# Patient Record
Sex: Male | Born: 1990 | Hispanic: Yes | Smoking: Never smoker
Health system: Southern US, Community
[De-identification: ages and names within clinical notes are randomized; demographics above are authoritative.]

---

## 2013-10-24 ENCOUNTER — Emergency Department (HOSPITAL_COMMUNITY): Payer: Self-pay

## 2013-10-24 ENCOUNTER — Encounter (HOSPITAL_COMMUNITY): Payer: Self-pay | Admitting: Emergency Medicine

## 2013-10-24 ENCOUNTER — Emergency Department (HOSPITAL_COMMUNITY)
Admission: EM | Admit: 2013-10-24 | Discharge: 2013-10-24 | Disposition: A | Discharge status: Home / Self Care | Payer: Self-pay | Attending: Emergency Medicine | Admitting: Emergency Medicine

## 2013-10-24 DIAGNOSIS — Z23 Encounter for immunization: Secondary | ICD-10-CM | POA: Insufficient documentation

## 2013-10-24 DIAGNOSIS — S61219A Laceration without foreign body of unspecified finger without damage to nail, initial encounter: Secondary | ICD-10-CM

## 2013-10-24 DIAGNOSIS — W260XXA Contact with knife, initial encounter: Secondary | ICD-10-CM | POA: Insufficient documentation

## 2013-10-24 DIAGNOSIS — S61209A Unspecified open wound of unspecified finger without damage to nail, initial encounter: Secondary | ICD-10-CM | POA: Insufficient documentation

## 2013-10-24 DIAGNOSIS — Y990 Civilian activity done for income or pay: Secondary | ICD-10-CM | POA: Insufficient documentation

## 2013-10-24 DIAGNOSIS — Y929 Unspecified place or not applicable: Secondary | ICD-10-CM | POA: Insufficient documentation

## 2013-10-24 MED ORDER — CEPHALEXIN 500 MG PO CAPS: 500.0000 mg | ORAL_CAPSULE | Freq: Four times a day (QID) | ORAL | Status: DC

## 2013-10-24 MED ORDER — TETANUS-DIPHTH-ACELL PERTUSSIS 5-2.5-18.5 LF-MCG/0.5 IM SUSP
0.5000 mL | Freq: Once | INTRAMUSCULAR | Status: AC
  Administered 2013-10-24: 0.5 mL via INTRAMUSCULAR
  Filled 2013-10-24: qty 0.5

## 2013-10-24 NOTE — ED Provider Notes (Signed)
CSN: [id number]     Arrival date & time 10/24/13  2105 History  This chart was scribed for non-physician practitioner, Sharilyn Sites, PA-C working with Richardean Canal, MD by Greggory Stallion, ED scribe. This patient was seen in room TR07C/TR07C and the patient's care was started at 9:37 PM.   Chief Complaint  Patient presents with  . Extremity Laceration   The history is provided by the patient. No language interpreter was used.   HPI Comments: Jake Arias is a 22 y.o. male who presents to the Emergency Department complaining of left middle finger laceration that occurred about an hour ago at work. Pt states he cut it with a knife.  Pt applied quick-clot prior to arrival which stopped the bleeding.  Unsure of last tetanus.  Pt is right hand dominant.  History reviewed. No pertinent past medical history. History reviewed. No pertinent past surgical history. History reviewed. No pertinent family history. History  Substance Use Topics  . Smoking status: Never Smoker   . Smokeless tobacco: Not on file  . Alcohol Use: No     Comment: socially    Review of Systems  Skin: Positive for wound.  All other systems reviewed and are negative.    Allergies  Review of patient's allergies indicates no known allergies.  Home Medications  No current outpatient prescriptions on file.  BP 145/81  Pulse 102  Temp(Src) 98.5 F (36.9 C) (Oral)  Resp 18  SpO2 98%  Physical Exam  Nursing note and vitals reviewed. Constitutional: He is oriented to person, place, and time. He appears well-developed and well-nourished. No distress.  HENT:  Head: Normocephalic and atraumatic.  Eyes: Conjunctivae and EOM are normal. Pupils are equal, round, and reactive to light.  Neck: Normal range of motion. Neck supple.  Cardiovascular: Normal rate, regular rhythm and normal heart sounds.   Pulmonary/Chest: Effort normal and breath sounds normal. No respiratory distress. He has no wheezes.  Musculoskeletal:  Normal range of motion.       Left hand: He exhibits tenderness and laceration. He exhibits normal range of motion, no bony tenderness, normal two-point discrimination, normal capillary refill, no deformity and no swelling. Normal sensation noted. Normal strength noted.       Hands: Avulsion laceration of left distal middle finger with some involvement of medial nail; full ROM of finger maintained; bleeding well controlled arrival with quick-clot present in wound; strong radial pulse and cap refill; sensation intact  Neurological: He is alert and oriented to person, place, and time.  Skin: Skin is warm and dry. He is not diaphoretic.  Psychiatric: He has a normal mood and affect.    ED Course  Procedures (including critical care time)  DIAGNOSTIC STUDIES: Oxygen Saturation is 98% on RA, normal by my interpretation.    COORDINATION OF CARE: 9:38 PM-Discussed treatment plan which includes xray with pt at bedside and pt agreed to plan.   Labs Review Labs Reviewed - No data to display Imaging Review Dg Finger Middle Left  10/24/2013   CLINICAL DATA:  Laceration tonight.  EXAM: LEFT MIDDLE FINGER 2+V  COMPARISON:  None.  FINDINGS: There is no evidence of fracture or dislocation. There is no evidence of arthropathy or other focal bone abnormality. Soft tissue laceration at the tip of the finger. No radiodense foreign body.  IMPRESSION: No acute osseous abnormality or foreign body.   Electronically Signed   By: Geanie Cooley M.D.   On: 10/24/2013 22:00    EKG Interpretation  None       MDM   1. Finger laceration, initial encounter    X-ray negative for bony involvement.  Avulsed tissue of left middle finger, not repairable.  Tetanus updated.  Wound irrigated and dressed in the ED.  Rx keflex.  Instructed to FU with hand surgeon if problems occur or for signs of infection.  Discussed plan with pt, he agreed.  Return precautions advised.  I personally performed the services described in  this documentation, which was scribed in my presence. The recorded information has been reviewed and is accurate.  Garlon Hatchet, PA-C 10/24/13 2328

## 2013-10-24 NOTE — ED Notes (Addendum)
Patient was at work and was washing dishes, he lacerated his middle left finger.  Bleeding controlled.  [employer] is employer.

## 2013-10-27 NOTE — ED Provider Notes (Signed)
Medical screening examination/treatment/procedure(s) were performed by non-physician practitioner and as supervising physician I was immediately available for consultation/collaboration.  EKG Interpretation   None         Richardean Canal, MD 10/27/13 0900

## 2016-03-16 ENCOUNTER — Emergency Department (HOSPITAL_COMMUNITY): Payer: Self-pay

## 2016-03-16 ENCOUNTER — Emergency Department (HOSPITAL_COMMUNITY)
Admission: EM | Admit: 2016-03-16 | Discharge: 2016-03-16 | Disposition: A | Discharge status: Home / Self Care | Payer: Self-pay | Attending: Emergency Medicine | Admitting: Emergency Medicine

## 2016-03-16 ENCOUNTER — Encounter (HOSPITAL_COMMUNITY): Payer: Self-pay | Admitting: Emergency Medicine

## 2016-03-16 DIAGNOSIS — R1011 Right upper quadrant pain: Secondary | ICD-10-CM

## 2016-03-16 DIAGNOSIS — K8050 Calculus of bile duct without cholangitis or cholecystitis without obstruction: Secondary | ICD-10-CM

## 2016-03-16 DIAGNOSIS — K8020 Calculus of gallbladder without cholecystitis without obstruction: Secondary | ICD-10-CM | POA: Insufficient documentation

## 2016-03-16 LAB — URINALYSIS, ROUTINE W REFLEX MICROSCOPIC
BILIRUBIN URINE: NEGATIVE
GLUCOSE, UA: NEGATIVE mg/dL
Hgb urine dipstick: NEGATIVE
KETONES UR: 15 mg/dL — AB
Leukocytes, UA: NEGATIVE
Nitrite: NEGATIVE
PH: 6 (ref 5.0–8.0)
Protein, ur: NEGATIVE mg/dL
Specific Gravity, Urine: 1.034 — ABNORMAL HIGH (ref 1.005–1.030)

## 2016-03-16 LAB — COMPREHENSIVE METABOLIC PANEL
ALT: 69 U/L — AB (ref 17–63)
AST: 38 U/L (ref 15–41)
Albumin: 4.9 g/dL (ref 3.5–5.0)
Alkaline Phosphatase: 77 U/L (ref 38–126)
Anion gap: 10 (ref 5–15)
BUN: 17 mg/dL (ref 6–20)
CO2: 22 mmol/L (ref 22–32)
Calcium: 9.6 mg/dL (ref 8.9–10.3)
Chloride: 104 mmol/L (ref 101–111)
Creatinine, Ser: 0.97 mg/dL (ref 0.61–1.24)
GFR calc Af Amer: >60 mL/min (ref >60)
GFR calc non Af Amer: >60 mL/min (ref >60)
Glucose, Bld: 126 mg/dL — ABNORMAL HIGH (ref 65–99)
Potassium: 3.7 mmol/L (ref 3.5–5.1)
Sodium: 136 mmol/L (ref 135–145)
TOTAL PROTEIN: 8.5 g/dL — AB (ref 6.5–8.1)
Total Bilirubin: 0.4 mg/dL (ref 0.3–1.2)

## 2016-03-16 LAB — CBC
HCT: 44.2 % (ref 39.0–52.0)
Hemoglobin: 14.7 g/dL (ref 13.0–17.0)
MCH: 30.8 pg (ref 26.0–34.0)
MCHC: 33.3 g/dL (ref 30.0–36.0)
MCV: 92.7 fL (ref 78.0–100.0)
PLATELETS: 205 10*3/uL (ref 150–400)
RBC: 4.77 MIL/uL (ref 4.22–5.81)
RDW: 12.6 % (ref 11.5–15.5)
WBC: 13.1 10*3/uL — ABNORMAL HIGH (ref 4.0–10.5)

## 2016-03-16 LAB — LIPASE, BLOOD: Lipase: 26 U/L (ref 11–51)

## 2016-03-16 MED ORDER — IBUPROFEN 600 MG PO TABS: 600.0000 mg | ORAL_TABLET | Freq: Four times a day (QID) | ORAL | Status: DC | PRN

## 2016-03-16 MED ORDER — OXYCODONE-ACETAMINOPHEN 5-325 MG PO TABS: 1.0000 | ORAL_TABLET | Freq: Four times a day (QID) | ORAL | Status: DC | PRN

## 2016-03-16 MED ORDER — FENTANYL CITRATE (PF) 100 MCG/2ML IJ SOLN
25.0000 ug | Freq: Once | INTRAMUSCULAR | Status: AC
  Administered 2016-03-16: 25 ug via INTRAVENOUS
  Filled 2016-03-16: qty 2

## 2016-03-16 MED ORDER — SODIUM CHLORIDE 0.9 % IV BOLUS (SEPSIS)
1000.0000 mL | Freq: Once | INTRAVENOUS | Status: AC
  Administered 2016-03-16: 1000 mL via INTRAVENOUS

## 2016-03-16 MED ORDER — ONDANSETRON HCL 4 MG/2ML IJ SOLN
4.0000 mg | Freq: Once | INTRAMUSCULAR | Status: AC
  Administered 2016-03-16: 4 mg via INTRAVENOUS
  Filled 2016-03-16: qty 2

## 2016-03-16 NOTE — ED Notes (Signed)
Patient transported to Ultrasound 

## 2016-03-16 NOTE — ED Provider Notes (Signed)
CSN: [id number]     Arrival date & time 03/16/16  1449 History   First MD Initiated Contact with Patient 03/16/16 1700     Chief Complaint  Patient presents with  . Abdominal Pain     (Consider location/radiation/quality/duration/timing/severity/associated sxs/prior Treatment) The history is provided by the patient.  Jake Arias is a 25 y.o. male here with RUQ pain, R flank pain. Acute onset of RUQ pain and R flank pain started at 2 am. It is constant, radiate to the flank. Has nausea but no vomiting. Denies urinary symptoms. Denies fever or chills. Denies eating bad food. Denies any chest pain or cough. He has kids at home that are also sick. Denies previous abdominal surgeries.    History reviewed. No pertinent past medical history. History reviewed. No pertinent past surgical history. No family history on file. Social History  Substance Use Topics  . Smoking status: Never Smoker   . Smokeless tobacco: None  . Alcohol Use: No     Comment: socially    Review of Systems  Gastrointestinal: Positive for abdominal pain.  All other systems reviewed and are negative.     Allergies  Review of patient's allergies indicates no known allergies.  Home Medications   Prior to Admission medications   Not on File   BP 140/77 mmHg  Pulse 79  Temp(Src) 99 F (37.2 C) (Oral)  Resp 18  Ht 5' (1.524 m)  Wt 188 lb 1 oz (85.305 kg)  BMI 36.73 kg/m2  SpO2 100% Physical Exam  Constitutional: He is oriented to person, place, and time. He appears well-developed and well-nourished.  HENT:  Head: Normocephalic.  Mouth/Throat: Oropharynx is clear and moist.  Eyes: Conjunctivae are normal. Pupils are equal, round, and reactive to light.  Neck: Normal range of motion. Neck supple.  Cardiovascular: Normal rate, regular rhythm and normal heart sounds.   Pulmonary/Chest: Effort normal and breath sounds normal. No respiratory distress. He has no wheezes. He has no rales.  Abdominal:  Soft. Bowel sounds are normal.  + RUQ tenderness, no obvious murphy's. Mild R CVAT. No periumbilical or lower abdominal tenderness   Musculoskeletal: Normal range of motion. He exhibits no edema or tenderness.  Neurological: He is alert and oriented to person, place, and time. No cranial nerve deficit. Coordination normal.  Skin: Skin is warm and dry.  Psychiatric: He has a normal mood and affect. His behavior is normal. Judgment and thought content normal.  Nursing note and vitals reviewed.   ED Course  Procedures (including critical care time) Labs Review Labs Reviewed  COMPREHENSIVE METABOLIC PANEL - Abnormal; Notable for the following:    Glucose, Bld 126 (*)    Total Protein 8.5 (*)    ALT 69 (*)    All other components within normal limits  CBC - Abnormal; Notable for the following:    WBC 13.1 (*)    All other components within normal limits  URINALYSIS, ROUTINE W REFLEX MICROSCOPIC (NOT AT [facility]) - Abnormal; Notable for the following:    Specific Gravity, Urine 1.034 (*)    Ketones, ur 15 (*)    All other components within normal limits  LIPASE, BLOOD    Imaging Review [place] Renal  03/16/2016  CLINICAL DATA:  Right flank pain for 1 day. No known injury. Initial encounter. EXAM: RENAL / URINARY TRACT ULTRASOUND COMPLETE COMPARISON:  None. FINDINGS: Right Kidney: Length: 12.3 cm. Echogenicity within normal limits. No mass or hydronephrosis visualized. Left Kidney: Length: 11.7 cm. Echogenicity  within normal limits. No mass or hydronephrosis visualized. Bladder: Appears normal for degree of bladder distention. IMPRESSION: Normal exam. Electronically Signed   By: Drusilla Kanner M.D.   On: 03/16/2016 18:37   US Abdomen Limited Ruq  03/16/2016  CLINICAL DATA:  Acute onset right upper quadrant pain today. No known injury. Initial encounter. EXAM: US ABDOMEN LIMITED - RIGHT UPPER QUADRANT COMPARISON:  None. FINDINGS: Gallbladder: Multiple gallstones are identified measuring up to 1.5  cm in diameter. There is no pericholecystic fluid or wall thickening. Sonographer reports negative Murphy's sign. Common bile duct: Diameter: 0.2 cm. Liver: Demonstrates increased echogenicity consistent with fatty infiltration. No focal lesion or intrahepatic biliary ductal dilatation is identified. Trace amount of perihepatic ascites is noted. IMPRESSION: Gallstones without evidence of cholecystitis. Fatty infiltration of the liver. Trace perihepatic ascites. Electronically Signed   By: Drusilla Kanner M.D.   On: 03/16/2016 18:39   I have personally reviewed and evaluated these images and lab results as part of my medical decision-making.   EKG Interpretation None      MDM   Final diagnoses:  RUQ pain    Jake Arias is a 25 y.o. male here with RUQ pain, R Flank pain. Consider biliary colic vs renal colic vs gastro vs pancreatitis. Will get labs, [place], UA. Will hydrate and reassess.   7:48 PM WBC 13. UA nl. US showed gallstones with no acute cholecystitis. Pain controlled. Will dc home with percocet, surgery follow up, recommend life style modifications.     Richardean Canal, MD 03/16/16 [phone]

## 2016-03-16 NOTE — Discharge Instructions (Signed)
Take motrin for pain.   Take percocet for severe pain. Do NOT drive with it.   Try to lose weight, avoid fatty foods.   See your doctor. See surgery for follow up   Return to ER if you have worse abdominal pain, vomiting, fevers

## 2016-03-16 NOTE — ED Notes (Signed)
Pt c/o abdominal pain onset 0200 today. Pt denies N/V.

## 2016-05-20 ENCOUNTER — Emergency Department (HOSPITAL_COMMUNITY): Payer: Self-pay

## 2016-05-20 ENCOUNTER — Encounter (HOSPITAL_COMMUNITY): Payer: Self-pay | Admitting: Emergency Medicine

## 2016-05-20 ENCOUNTER — Emergency Department (HOSPITAL_COMMUNITY)
Admission: EM | Admit: 2016-05-20 | Discharge: 2016-05-20 | Disposition: A | Discharge status: Home / Self Care | Payer: Self-pay | Attending: Emergency Medicine | Admitting: Emergency Medicine

## 2016-05-20 DIAGNOSIS — K2990 Gastroduodenitis, unspecified, without bleeding: Secondary | ICD-10-CM | POA: Insufficient documentation

## 2016-05-20 LAB — I-STAT CHEM 8, ED
BUN: 12 mg/dL (ref 6–20)
Calcium, Ion: 1.15 mmol/L (ref 1.12–1.23)
Chloride: 98 mmol/L — ABNORMAL LOW (ref 101–111)
Creatinine, Ser: 0.7 mg/dL (ref 0.61–1.24)
Glucose, Bld: 128 mg/dL — ABNORMAL HIGH (ref 65–99)
HEMATOCRIT: 47 % (ref 39.0–52.0)
HEMOGLOBIN: 16 g/dL (ref 13.0–17.0)
POTASSIUM: 3.9 mmol/L (ref 3.5–5.1)
SODIUM: 137 mmol/L (ref 135–145)
TCO2: 25 mmol/L (ref 0–100)

## 2016-05-20 LAB — CBC WITH DIFFERENTIAL/PLATELET
Basophils Absolute: 0 10*3/uL (ref 0.0–0.1)
Basophils Relative: 0 %
EOS PCT: 1 %
Eosinophils Absolute: 0.2 10*3/uL (ref 0.0–0.7)
HCT: 44.6 % (ref 39.0–52.0)
Hemoglobin: 14.9 g/dL (ref 13.0–17.0)
LYMPHS PCT: 11 %
Lymphs Abs: 2.2 10*3/uL (ref 0.7–4.0)
MCH: 30.5 pg (ref 26.0–34.0)
MCHC: 33.4 g/dL (ref 30.0–36.0)
MCV: 91.2 fL (ref 78.0–100.0)
MONO ABS: 1 10*3/uL (ref 0.1–1.0)
Monocytes Relative: 5 %
Neutro Abs: 16 10*3/uL — ABNORMAL HIGH (ref 1.7–7.7)
Neutrophils Relative %: 83 %
PLATELETS: 194 10*3/uL (ref 150–400)
RBC: 4.89 MIL/uL (ref 4.22–5.81)
RDW: 12.6 % (ref 11.5–15.5)
WBC: 19.4 10*3/uL — ABNORMAL HIGH (ref 4.0–10.5)

## 2016-05-20 LAB — RAPID URINE DRUG SCREEN, HOSP PERFORMED
Amphetamines: NOT DETECTED
Barbiturates: NOT DETECTED
Benzodiazepines: NOT DETECTED
COCAINE: NOT DETECTED
Opiates: NOT DETECTED
Tetrahydrocannabinol: NOT DETECTED

## 2016-05-20 LAB — URINALYSIS, ROUTINE W REFLEX MICROSCOPIC
Bilirubin Urine: NEGATIVE
GLUCOSE, UA: NEGATIVE mg/dL
HGB URINE DIPSTICK: NEGATIVE
Ketones, ur: NEGATIVE mg/dL
LEUKOCYTES UA: NEGATIVE
Nitrite: NEGATIVE
PH: 6 (ref 5.0–8.0)
Protein, ur: NEGATIVE mg/dL
Specific Gravity, Urine: 1.016 (ref 1.005–1.030)

## 2016-05-20 LAB — LIPASE, BLOOD: Lipase: 23 U/L (ref 11–51)

## 2016-05-20 LAB — HEPATIC FUNCTION PANEL
ALT: 40 U/L (ref 17–63)
AST: 29 U/L (ref 15–41)
Albumin: 4.6 g/dL (ref 3.5–5.0)
Alkaline Phosphatase: 81 U/L (ref 38–126)
BILIRUBIN DIRECT: 0.2 mg/dL (ref 0.1–0.5)
Indirect Bilirubin: 0.5 mg/dL (ref 0.3–0.9)
TOTAL PROTEIN: 8.1 g/dL (ref 6.5–8.1)
Total Bilirubin: 0.7 mg/dL (ref 0.3–1.2)

## 2016-05-20 MED ORDER — FENTANYL CITRATE (PF) 100 MCG/2ML IJ SOLN
100.0000 ug | Freq: Once | INTRAMUSCULAR | Status: AC
  Administered 2016-05-20: 100 ug via INTRAVENOUS
  Filled 2016-05-20: qty 2

## 2016-05-20 MED ORDER — METRONIDAZOLE 500 MG PO TABS: 500.0000 mg | ORAL_TABLET | Freq: Three times a day (TID) | ORAL | Status: DC

## 2016-05-20 MED ORDER — AMOXICILLIN 500 MG PO CAPS: 500.0000 mg | ORAL_CAPSULE | Freq: Three times a day (TID) | ORAL | Status: DC

## 2016-05-20 MED ORDER — GI COCKTAIL ~~LOC~~
30.0000 mL | Freq: Once | ORAL | Status: AC
  Administered 2016-05-20: 30 mL via ORAL
  Filled 2016-05-20: qty 30

## 2016-05-20 MED ORDER — OMEPRAZOLE 20 MG PO CPDR: 20.0000 mg | DELAYED_RELEASE_CAPSULE | Freq: Every day | ORAL | Status: DC

## 2016-05-20 MED ORDER — IOPAMIDOL (ISOVUE-300) INJECTION 61%
100.0000 mL | Freq: Once | INTRAVENOUS | Status: AC | PRN
  Administered 2016-05-20: 100 mL via INTRAVENOUS

## 2016-05-20 NOTE — Discharge Instructions (Signed)
Duodenitis (Duodenitis) Duodenitis es la inflamacin del revestimiento de la primera parte del intestino delgado (duodeno). Hay dos tipos de duodenitis:   Duodenitis aguda (se desarrolla repentinamente y es de corta duracin).   Duodenitis crnica (desarrolla durante un perodo prolongado y dura meses o aos). CAUSAS  La causa de la duodenitis casi siempre es una infeccin por la bacteria Helicobacter pylori (H. pylori). El H. pylori aumenta la produccin de cido del estmago y Paediatric nurse cambios en el entorno del duodeno. Esto irrita y daa las clulas del duodeno causando inflamacin.  Otras causas de duodenitis son:   El uso por un largo plazo de medicamentos antiinflamatorios no esteroides (AINE). Los [employer] modifican el revestimiento del duodeno y lo hacen ms propenso a las lesiones por el cido Data processing manager.  El consumo excesivo de alcohol. El alcohol aumenta los cidos del estmago y modifica el revestimiento del duodeno, lo que lo hace ms propenso a Radio broadcast assistant.  Giardiasis. La giardiasis es una infeccin frecuente del intestino delgado. Produce inflamacin en el duodeno.   Otros trastornos gastrointestinales, como la enfermedad de Crohn. Las personas con estos trastornos son ms propensas a desarrollar duodenitis. SNTOMAS  Aunque duodenitis no siempre causa sntomas, podr sentir:   Nuseas o vmitos.  Gases, sensacin de hinchazn o sensacin incmoda de plenitud despus de comer.  Ardor, clicos o dolor en la zona superior del abdomen. DIAGNSTICO  Para el diagnstico de duodenitis, el mdico puede [employer] de:   Examen del duodeno utilizando un tubo delgado con una cmara diminuta en la punta, que se coloca a travs de la garganta (endoscopio). El endoscopio se pasa a travs del estmago y [place]. A veces se toma una muestra de tejido del duodeno con el endoscopio. La muestra se examina luego bajo el microscopio (biopsia) para detectar signos  de inflamacin e infeccin por H. pylori.   Estudios para Publishing copy de [place] o de heces y Engineer, manufacturing infeccin por H. pylori i.   Un estudio para [employer] gases en una muestra de aliento exhalado para la infeccin por  H. pylori. Esta prueba mide los niveles de dixido de carbono en el aliento despus de beber una solucin especial.  Radiografas luego de beber un lquido especial que hace resaltar el tracto digestivo (bario) para encontrar signos de inflamacin. TRATAMIENTO  El tratamiento depender de la causa de la duodenitis. Los tratamientos ms comunes incluyen:   Frmacos para tratar la infeccin.  Medicamentos para reducir Primary school teacher.  Suspender el uso de AINEs.  Control de otras afecciones gastrointestinales.  Evitar el consumo de alcohol. Adems, si sigue las siguientes indicaciones podr reducir la gravedad de sus sntomas:   Beba suficiente agua para mantener la orina clara o de color amarillo plido.  Evite el consumo de los siguientes alimentos o bebidas:  Bebidas que contengan cafena.  Chocolate.  Alimentos o bebidas saborizadas con menta o mentol.  Ajo.  Cebollas.  Comidas muy condimentadas.  Ctricos como naranjas, limones o limas.  Alimentos que contengan salsa de [place], como salsas para pastas, chiles y pizza.  Alimentos muy grasos.  Comidas fritas.   Esta informacin no tiene Theme park manager el consejo del mdico. Asegrese de hacerle al mdico cualquier pregunta que tenga.   Document Released: 04/04/2013 Document Revised: 12/29/2014 Elsevier Interactive Patient Education 2016 [employer].  Gastritis - Adultos  (Gastritis, Adult)  Gastrittis es la hinchazn e irritacin (inflamacin) del revestimiento interno del estmago. Si no recibe tratamiento, la gastritis puede  causar sangrado y llagas.(lceras) en el estmago. CUIDADOS EN EL HOGAR   Slo tome los medicamentos segn le indique el mdico.  Si le han recetado  antibiticos, tmelos segn las indicaciones. Termine de tomar el medicamento, aunque comience a sentirse mejor.  Beba gran cantidad de lquido para mantenLeggett & Platter el pis (orina) de tono claro o amarillo plido.  Evite las comidas y bebidas que United Stationersempeoran los problemas. Los alimentos que debe evitar son:  Geri SeminoleCafena y alcohol.  Chocolate.  Menta.  Ajo y cebolla.  Comidas muy condimentadas.  Ctricos como naranjas, limones o limas.  Alimentos que contengan tomate, como salsas, [place] y pizza.  Alimentos fritos y Lexicographergrasos.  Haga comidas pequeas durante Glass blower/designerel da en lugar de 3 comidas abundantes. SOLICITE AYUDA DE INMEDIATO SI:   La materia fecal (heces)es negra o de color rojo oscuro.  Vomita sangre. Puede ser similar a la borra del caf  No puede retener los lquidos.  El dolor en el vientre (abdominal) empeora.  Tiene fiebre.  No mejora luego de 1 semana.  Tiene preguntas o preocupaciones. ASEGRESE DE QUE:   Comprende estas instrucciones.  Controlar su enfermedad.  Solicitar ayuda de inmediato si no mejora o si empeora.   Esta informacin no tiene Theme park managercomo fin reemplazar el consejo del mdico. Asegrese de hacerle al mdico cualquier pregunta que tenga.   Document Released: 06/08/2012 Elsevier Interactive Patient Education [employer]2016 Elsevier Inc.

## 2016-05-20 NOTE — ED Notes (Signed)
Pt returns from CT with c/o pain ar RUQ at 3/10. Pt denies vomiting and speaks easlily. Family at bedside.

## 2016-05-20 NOTE — ED Provider Notes (Addendum)
CSN: [id number]     Arrival date & time 05/20/16  0426 History   First MD Initiated Contact with Patient 05/20/16 [phone]     Chief Complaint  Patient presents with  . Cholelithiasis     (Consider location/radiation/quality/duration/timing/severity/associated sxs/prior Treatment) Patient is a 25 y.o. male presenting with abdominal pain. The history is provided by the patient.  Abdominal Pain Pain location:  RUQ, R flank, LUQ, epigastric and periumbilical Pain quality: not tugging   Pain radiates to:  Does not radiate Pain severity:  Severe Onset quality:  Gradual Duration:  9 weeks Timing:  Constant Progression:  Unchanged Chronicity:  New Context: not alcohol use and not sick contacts   Relieved by:  Nothing Worsened by:  Nothing tried Ineffective treatments:  None tried Associated symptoms: no anorexia, no dysuria, no fever, no hematuria, no nausea and no vomiting   Risk factors: no alcohol abuse     History reviewed. No pertinent past medical history. History reviewed. No pertinent past surgical history. History reviewed. No pertinent family history. Social History  Substance Use Topics  . Smoking status: Never Smoker   . Smokeless tobacco: None  . Alcohol Use: No     Comment: socially    Review of Systems  Constitutional: Negative for fever.  Gastrointestinal: Positive for abdominal pain. Negative for nausea, vomiting and anorexia.  Genitourinary: Negative for dysuria and hematuria.  All other systems reviewed and are negative.     Allergies  Review of patient's allergies indicates no known allergies.  Home Medications   Prior to Admission medications   Medication Sig Start Date End Date Taking? Authorizing Provider  ibuprofen (ADVIL,MOTRIN) 600 MG tablet Take 1 tablet (600 mg total) by mouth every 6 (six) hours as needed. 03/16/16   Richardean Canalavid H Yao, MD  oxyCODONE-acetaminophen (PERCOCET) 5-325 MG tablet Take 1 tablet by mouth every 6 (six) hours as needed.  03/16/16   Richardean Canalavid H Yao, MD   BP 123/82 mmHg  Pulse 82  Temp(Src) 97.4 F (36.3 C) (Oral)  Resp 18  SpO2 98% Physical Exam  Constitutional: He is oriented to person, place, and time. He appears well-developed and well-nourished. No distress.  HENT:  Head: Normocephalic and atraumatic.  Mouth/Throat: Oropharynx is clear and moist.  Eyes: Conjunctivae are normal. Pupils are equal, round, and reactive to light.  Neck: Normal range of motion. Neck supple.  Cardiovascular: Normal rate, regular rhythm and intact distal pulses.   Pulmonary/Chest: Effort normal and breath sounds normal. No respiratory distress. He has no wheezes. He has no rales.  Abdominal: Soft. Bowel sounds are normal. There is no tenderness. There is no rigidity, no rebound, no guarding, no tenderness at McBurney's point and negative Murphy's sign. No hernia.  Musculoskeletal: Normal range of motion.  Neurological: He is alert and oriented to person, place, and time.  Skin: Skin is warm and dry.  Psychiatric: He has a normal mood and affect.    ED Course  Procedures (including critical care time) Labs Review Labs Reviewed  CBC WITH DIFFERENTIAL/PLATELET - Abnormal; Notable for the following:    WBC 19.4 (*)    Neutro Abs 16.0 (*)    All other components within normal limits  I-STAT CHEM 8, ED - Abnormal; Notable for the following:    Chloride 98 (*)    Glucose, Bld 128 (*)    All other components within normal limits  HEPATIC FUNCTION PANEL  LIPASE, BLOOD  URINE RAPID DRUG SCREEN, HOSP PERFORMED    Imaging Review  No results found. I have personally reviewed and evaluated these images and lab results as part of my medical decision-making.   EKG Interpretation None      MDM   Final diagnoses:  None    Filed Vitals:   05/20/16 0630 05/20/16 0645  BP: 123/69 119/68  Pulse: 96 97  Temp:    Resp:  14   Results for orders placed or performed during the hospital encounter of 05/20/16  CBC with  Differential/Platelet  Result Value Ref Range   WBC 19.4 (H) 4.0 - 10.5 K/uL   RBC 4.89 4.22 - 5.81 MIL/uL   Hemoglobin 14.9 13.0 - 17.0 g/dL   HCT 16.1 09.6 - 04.5 %   MCV 91.2 78.0 - 100.0 fL   MCH 30.5 26.0 - 34.0 pg   MCHC 33.4 30.0 - 36.0 g/dL   RDW 40.9 81.1 - 91.4 %   Platelets 194 150 - 400 K/uL   Neutrophils Relative % 83 %   Neutro Abs 16.0 (H) 1.7 - 7.7 K/uL   Lymphocytes Relative 11 %   Lymphs Abs 2.2 0.7 - 4.0 K/uL   Monocytes Relative 5 %   Monocytes Absolute 1.0 0.1 - 1.0 K/uL   Eosinophils Relative 1 %   Eosinophils Absolute 0.2 0.0 - 0.7 K/uL   Basophils Relative 0 %   Basophils Absolute 0.0 0.0 - 0.1 K/uL  Hepatic function panel  Result Value Ref Range   Total Protein 8.1 6.5 - 8.1 g/dL   Albumin 4.6 3.5 - 5.0 g/dL   AST 29 15 - 41 U/L   ALT 40 17 - 63 U/L   Alkaline Phosphatase 81 38 - 126 U/L   Total Bilirubin 0.7 0.3 - 1.2 mg/dL   Bilirubin, Direct 0.2 0.1 - 0.5 mg/dL   Indirect Bilirubin 0.5 0.3 - 0.9 mg/dL  Lipase, blood  Result Value Ref Range   Lipase 23 11 - 51 U/L  Urine rapid drug screen (hosp performed)  Result Value Ref Range   Opiates NONE DETECTED NONE DETECTED   Cocaine NONE DETECTED NONE DETECTED   Benzodiazepines NONE DETECTED NONE DETECTED   Amphetamines NONE DETECTED NONE DETECTED   Tetrahydrocannabinol NONE DETECTED NONE DETECTED   Barbiturates NONE DETECTED NONE DETECTED  Urinalysis, Routine w reflex microscopic (not at [facility])  Result Value Ref Range   Color, Urine YELLOW YELLOW   APPearance CLEAR CLEAR   Specific Gravity, Urine 1.016 1.005 - 1.030   pH 6.0 5.0 - 8.0   Glucose, UA NEGATIVE NEGATIVE mg/dL   Hgb urine dipstick NEGATIVE NEGATIVE   Bilirubin Urine NEGATIVE NEGATIVE   Ketones, ur NEGATIVE NEGATIVE mg/dL   Protein, ur NEGATIVE NEGATIVE mg/dL   Nitrite NEGATIVE NEGATIVE   Leukocytes, UA NEGATIVE NEGATIVE  I-Stat Chem 8, ED  Result Value Ref Range   Sodium 137 135 - 145 mmol/L   Potassium 3.9 3.5 - 5.1 mmol/L    Chloride 98 (L) 101 - 111 mmol/L   BUN 12 6 - 20 mg/dL   Creatinine, Ser 7.82 0.61 - 1.24 mg/dL   Glucose, Bld 956 (H) 65 - 99 mg/dL   Calcium, Ion 2.13 0.86 - 1.23 mmol/L   TCO2 25 0 - 100 mmol/L   Hemoglobin 16.0 13.0 - 17.0 g/dL   HCT 57.8 46.9 - 62.9 %   Ct Abdomen Pelvis W Contrast  05/20/2016  CLINICAL DATA:  Right upper quadrant pain EXAM: CT ABDOMEN AND PELVIS WITH CONTRAST TECHNIQUE: Multidetector CT imaging of the abdomen and  pelvis was performed using the standard protocol following bolus administration of intravenous contrast. CONTRAST:  100 mL Isovue-300 COMPARISON:  03/16/2016 FINDINGS: Lung bases are free of acute infiltrate or sizable effusion. The liver, spleen, adrenal glands and pancreas are within normal limits. The gallbladder is well distended. The previously seen gallstones are not well appreciated on this study. No biliary obstruction is seen. Kidneys are well visualized bilaterally. No renal calculi or urinary tract obstructive changes are seen. The appendix is within normal limits. The bladder is partially distended. No pelvic mass lesion or sidewall abnormality is noted. The osseous structures show no acute abnormality. IMPRESSION: No acute abnormality noted. Electronically Signed   By: Alcide Clever M.D.   On: 05/20/2016 07:15     Medication List    TAKE these medications        amoxicillin 500 MG capsule  Commonly known as:  AMOXIL  Take 1 capsule (500 mg total) by mouth 3 (three) times daily.     metroNIDAZOLE 500 MG tablet  Commonly known as:  FLAGYL  Take 1 tablet (500 mg total) by mouth 3 (three) times daily.     omeprazole 20 MG capsule  Commonly known as:  PRILOSEC  Take 1 capsule (20 mg total) by mouth daily.      ASK your doctor about these medications        ibuprofen 600 MG tablet  Commonly known as:  ADVIL,MOTRIN  Take 1 tablet (600 mg total) by mouth every 6 (six) hours as needed.        Given the ongoing upper abdomen pain without  ceasing for 2 months and no LFT abnormalities with elevated WBC count I suspect gastritis and ulcer disease.  As he has no insurance follow up will be difficult so we are opting to treat H. Pylori with quad cocktail of flagyl, amoxicillin, PPI BID and peptobismol which I have instructed the patient to buy over the counter.  While there are several treatments for H. Pylori, this particular regimen is recommended but has the best chance of compliance  As these medication are on 4 dollar list at [facility], as the patient does not have insurance.  I explained that he will need to eat [place] yogurt as a probiotic to present GI issues from antibiotics.  Patient will need to eat a bland diet and we went over in detail what he can and cannot eat.  He is instructed to follow up with GI, and he and his family verbal understanding and agree to follow up.  He was given strict abdominal return precautions.      Cy Blamer, MD 05/20/16 0750  Shariah Assad, MD 05/20/16 [phone]

## 2016-05-20 NOTE — ED Notes (Signed)
Pt  And family states they understand instructions. Interpretor phone used to answer all questions.

## 2016-05-20 NOTE — ED Notes (Signed)
Pt arrived to ED via POV. Pain from previous gall stones never went away. Pain present on right side and radiates to right flank area. Pain rated at 10 on scale of 0 to 10. Attempted Ibuprofen at home with no relief.

## 2016-05-20 NOTE — ED Notes (Signed)
Patient transported to CT 

## 2018-07-09 ENCOUNTER — Other Ambulatory Visit: Payer: Self-pay

## 2018-07-09 ENCOUNTER — Encounter (HOSPITAL_COMMUNITY): Payer: Self-pay | Admitting: Obstetrics and Gynecology

## 2018-07-09 DIAGNOSIS — R739 Hyperglycemia, unspecified: Secondary | ICD-10-CM | POA: Diagnosis present

## 2018-07-09 DIAGNOSIS — K8067 Calculus of gallbladder and bile duct with acute and chronic cholecystitis with obstruction: Principal | ICD-10-CM | POA: Diagnosis present

## 2018-07-09 NOTE — ED Triage Notes (Signed)
Per Pt: Pt reports abdominal/flank pain that started last night. Pt reports he last went to the bathroom this morning.  Pt reports no change in appetite  Pt reports pain 8/10

## 2018-07-10 ENCOUNTER — Inpatient Hospital Stay (HOSPITAL_COMMUNITY)
Admission: EM | Admit: 2018-07-10 | Discharge: 2018-07-13 | DRG: 419 | Disposition: A | Discharge status: Home / Self Care | Payer: Self-pay | Attending: Surgery | Admitting: Surgery

## 2018-07-10 ENCOUNTER — Inpatient Hospital Stay (HOSPITAL_COMMUNITY): Payer: Self-pay

## 2018-07-10 ENCOUNTER — Other Ambulatory Visit: Payer: Self-pay

## 2018-07-10 ENCOUNTER — Encounter (HOSPITAL_COMMUNITY): Payer: Self-pay | Admitting: *Deleted

## 2018-07-10 ENCOUNTER — Emergency Department (HOSPITAL_COMMUNITY): Payer: Self-pay

## 2018-07-10 DIAGNOSIS — R945 Abnormal results of liver function studies: Secondary | ICD-10-CM

## 2018-07-10 DIAGNOSIS — K8050 Calculus of bile duct without cholangitis or cholecystitis without obstruction: Secondary | ICD-10-CM

## 2018-07-10 DIAGNOSIS — R1011 Right upper quadrant pain: Secondary | ICD-10-CM

## 2018-07-10 DIAGNOSIS — K8020 Calculus of gallbladder without cholecystitis without obstruction: Secondary | ICD-10-CM | POA: Diagnosis present

## 2018-07-10 DIAGNOSIS — R7401 Elevation of levels of liver transaminase levels: Secondary | ICD-10-CM

## 2018-07-10 DIAGNOSIS — K8001 Calculus of gallbladder with acute cholecystitis with obstruction: Secondary | ICD-10-CM

## 2018-07-10 DIAGNOSIS — R740 Nonspecific elevation of levels of transaminase and lactic acid dehydrogenase [LDH]: Secondary | ICD-10-CM

## 2018-07-10 DIAGNOSIS — R52 Pain, unspecified: Secondary | ICD-10-CM

## 2018-07-10 LAB — URINALYSIS, ROUTINE W REFLEX MICROSCOPIC
BACTERIA UA: NONE SEEN
BILIRUBIN URINE: NEGATIVE
Glucose, UA: NEGATIVE mg/dL
Ketones, ur: 5 mg/dL — AB
Leukocytes, UA: NEGATIVE
NITRITE: NEGATIVE
PROTEIN: NEGATIVE mg/dL
SPECIFIC GRAVITY, URINE: 1.018 (ref 1.005–1.030)
pH: 7 (ref 5.0–8.0)

## 2018-07-10 LAB — ETHANOL: Alcohol, Ethyl (B): <10 mg/dL (ref <10)

## 2018-07-10 LAB — COMPREHENSIVE METABOLIC PANEL
ALT: 733 U/L — ABNORMAL HIGH (ref 0–44)
ANION GAP: 8 (ref 5–15)
AST: 586 U/L — AB (ref 15–41)
Albumin: 4.9 g/dL (ref 3.5–5.0)
Alkaline Phosphatase: 114 U/L (ref 38–126)
BILIRUBIN TOTAL: 3.4 mg/dL — AB (ref 0.3–1.2)
BUN: 12 mg/dL (ref 6–20)
CHLORIDE: 103 mmol/L (ref 98–111)
CO2: 25 mmol/L (ref 22–32)
Calcium: 9.4 mg/dL (ref 8.9–10.3)
Creatinine, Ser: 0.73 mg/dL (ref 0.61–1.24)
GFR calc Af Amer: >60 mL/min (ref >60)
GLUCOSE: 137 mg/dL — AB (ref 70–99)
POTASSIUM: 3.9 mmol/L (ref 3.5–5.1)
Sodium: 136 mmol/L (ref 135–145)
Total Protein: 8.1 g/dL (ref 6.5–8.1)

## 2018-07-10 LAB — CBC
HEMATOCRIT: 43.3 % (ref 39.0–52.0)
HEMOGLOBIN: 14.9 g/dL (ref 13.0–17.0)
MCH: 31.8 pg (ref 26.0–34.0)
MCHC: 34.4 g/dL (ref 30.0–36.0)
MCV: 92.5 fL (ref 78.0–100.0)
Platelets: 209 10*3/uL (ref 150–400)
RBC: 4.68 MIL/uL (ref 4.22–5.81)
RDW: 12.5 % (ref 11.5–15.5)
WBC: 6.1 10*3/uL (ref 4.0–10.5)

## 2018-07-10 LAB — LIPASE, BLOOD: LIPASE: 31 U/L (ref 11–51)

## 2018-07-10 LAB — ACETAMINOPHEN LEVEL

## 2018-07-10 MED ORDER — MORPHINE SULFATE (PF) 2 MG/ML IV SOLN
1.0000 mg | INTRAVENOUS | Status: DC | PRN
  Administered 2018-07-10 (×2): 2 mg via INTRAVENOUS
  Filled 2018-07-10 (×2): qty 1

## 2018-07-10 MED ORDER — GADOBENATE DIMEGLUMINE 529 MG/ML IV SOLN
20.0000 mL | Freq: Once | INTRAVENOUS | Status: AC | PRN
  Administered 2018-07-10: 18 mL via INTRAVENOUS

## 2018-07-10 MED ORDER — KCL IN DEXTROSE-NACL 20-5-0.9 MEQ/L-%-% IV SOLN
INTRAVENOUS | Status: DC
  Administered 2018-07-10 – 2018-07-11 (×2): via INTRAVENOUS
  Filled 2018-07-10 (×2): qty 1000

## 2018-07-10 MED ORDER — MORPHINE SULFATE (PF) 4 MG/ML IV SOLN
4.0000 mg | Freq: Once | INTRAVENOUS | Status: AC
  Administered 2018-07-10: 4 mg via INTRAVENOUS
  Filled 2018-07-10: qty 1

## 2018-07-10 MED ORDER — ONDANSETRON 4 MG PO TBDP: 4.0000 mg | ORAL_TABLET | Freq: Four times a day (QID) | ORAL | Status: DC | PRN

## 2018-07-10 MED ORDER — FAMOTIDINE IN NACL 20-0.9 MG/50ML-% IV SOLN
20.0000 mg | Freq: Two times a day (BID) | INTRAVENOUS | Status: DC
  Administered 2018-07-10 (×2): 20 mg via INTRAVENOUS
  Filled 2018-07-10 (×2): qty 50

## 2018-07-10 MED ORDER — ONDANSETRON HCL 4 MG/2ML IJ SOLN: 4.0000 mg | Freq: Four times a day (QID) | INTRAMUSCULAR | Status: DC | PRN

## 2018-07-10 MED ORDER — CHLORHEXIDINE GLUCONATE 0.12 % MT SOLN
15.0000 mL | Freq: Two times a day (BID) | OROMUCOSAL | Status: DC
  Administered 2018-07-10 – 2018-07-12 (×4): 15 mL via OROMUCOSAL
  Filled 2018-07-10 (×5): qty 15

## 2018-07-10 MED ORDER — HEPARIN SODIUM (PORCINE) 5000 UNIT/ML IJ SOLN
5000.0000 [IU] | Freq: Three times a day (TID) | INTRAMUSCULAR | Status: DC
  Administered 2018-07-10 – 2018-07-11 (×3): 5000 [IU] via SUBCUTANEOUS
  Filled 2018-07-10 (×3): qty 1

## 2018-07-10 MED ORDER — ORAL CARE MOUTH RINSE: 15.0000 mL | Freq: Two times a day (BID) | OROMUCOSAL | Status: DC

## 2018-07-10 MED ORDER — METHOCARBAMOL 500 MG PO TABS: 500.0000 mg | ORAL_TABLET | Freq: Four times a day (QID) | ORAL | Status: DC | PRN

## 2018-07-10 NOTE — H&P (View-Only) (Signed)
[facility] Consult: 8:56 AM 07/10/2018  LOS: 0 days    Referring Provider: Dr Nicanor Alcon  Primary Care Physician:  Patient, No Pcp Per Primary Gastroenterologist:  unassigned     Reason for Consultation:  Abdominal pain, gallstones,    HPI: Jake Arias is a 27 y.o. male.  Generally healthy young man who works in [employer].  Episodes of right upper quadrant and flank pain prompting a few visits to the ED in 2017.  Ultrasound then showed uncomplicated gallstones, LFTs normal white count elevated to 13.  Working diagnosis was biliary colic versus renal colic versus gastritis versus pancreatitis.  Patient did not have suggested surgical follow-up.    Starting Thursday night after eating a sandwich he developed pain in his right upper quadrant, no nausea, no jaundice.  Pain did radiate to the scapular area.  He had a prescription for Protonix which she took but did not help with the pain.  He presented to the ED early this morning.  Pain improved following 4 mg morphine.  Last ate Thursday night, currently hungry and would love to have some water.  T bili 3.4.  Alkaline phosphatase 114.  AST/ALT 586/733. No anemia, no elevated white count.  Platelets normal. APAP level less than 10. Glucose 137. Abdominal ultrasound: uncomplicated cholelithiasis.  No gallbladder wall thickening or pericholecystic fluid.  CBD 5mm. MRCP ordered.  Patient drinks 6-12 beers every couple of weeks but does not drink on a daily basis. Family history negative for gallbladder, pancreas, peptic ulcer disease or gastrointestinal cancers.  He knows of an uncle who has diabetes.   History reviewed. No pertinent past medical history.  History reviewed. No pertinent surgical history.  Prior to Admission medications   Medication Sig  Start Date End Date Taking? Authorizing Provider  pantoprazole (PROTONIX) 20 MG tablet Take 20 mg by mouth daily.   Yes [provider]    Scheduled Meds:  Infusions:  PRN Meds:    Allergies as of 07/09/2018  . (No Known Allergies)    No family history on file.  Social History   Socioeconomic History  . Marital status: Single    Spouse name: Not on file  . Number of children: Not on file  . Years of education: Not on file  . Highest education level: Not on file  Occupational History  . Not on file  Social Needs  . Financial resource strain: Not on file  . Food insecurity:    Worry: Not on file    Inability: Not on file  . Transportation needs:    Medical: Not on file    Non-medical: Not on file  Tobacco Use  . Smoking status: Never Smoker  . Smokeless tobacco: Never Used  Substance and Sexual Activity  . Alcohol use: Yes    Comment: socially  . Drug use: No  . Sexual activity: Not on file  Lifestyle  . Physical activity:    Days per week: Not on file    Minutes per session: Not on file  . Stress: Not on  file  Relationships  . Social connections:    Talks on phone: Not on file    Gets together: Not on file    Attends religious service: Not on file    Active member of club or organization: Not on file    Attends meetings of clubs or organizations: Not on file    Relationship status: Not on file  . Intimate partner violence:    Fear of current or ex partner: Not on file    Emotionally abused: Not on file    Physically abused: Not on file    Forced sexual activity: Not on file  Other Topics Concern  . Not on file  Social History Narrative  . Not on file    REVIEW OF SYSTEMS: Constitutional: No weakness, no fatigue. ENT:  No nose bleeds Pulm: No shortness of breath.  No cough. CV:  No palpitations, no LE edema.  GU:  No hematuria, no frequency.  Skin treated and very yellow compared to normal. GI: No dysphagia.  No altered bowel  habits. Heme: No issues with excessive or unusual bleeding or bruising. Transfusions: Never. Neuro:  No headaches, no peripheral tingling or numbness.   No dizziness, no syncope. Derm:  No itching, no rash or sores.  No jaundice. Endocrine:  No sweats or chills.  No polyuria or dysuria Immunization: Did not inquire as to recent or previous vaccinations. Travel:  None beyond local counties in last few months.    PHYSICAL EXAM: Vital signs in last 24 hours: Vitals:   07/10/18 0700 07/10/18 0830  BP: (!) 103/55 128/79  Pulse: 65 87  Resp: 16 18  Temp:    SpO2: 99% 97%   Wt Readings from Last 3 Encounters:  03/16/16 188 lb 1 oz (85.3 kg)    General: Pleasant, looks well and comfortable.  Interviewed using the Furniture conservator/restorervideo-assisted translator. Head: No facial asymmetry, signs of head trauma or swelling. Eyes: Icterus, no conjunctival pallor.  EOMI. Ears: Not HOH. Nose: No congestion or discharge. Mouth: Good dentition.  Moist, pink, clear oral mucosa.  Tongue midline. Neck: No JVD, no masses, no thyromegaly. Lungs: Clear bilaterally no labored breathing or cough. Heart: RRR.  No MRG.  S1, S2 present Abdomen: Soft.  Nondistended.  Minimal tenderness in the right upper quadrant without guarding or rebound.  No HSM, bruits, hernias, masses.  Bowel sounds active.   Rectal: Deferred Musc/Skeltl: No joint redness, swelling or significant deformity Extremities: No CCE. Neurologic: Alert.  Oriented x3.  Moves all 4 limbs, strength not tested.  No tremor or gross neurologic deficits. Skin: No obvious jaundice, could be missed given he is Hispanic and has olive complexion. Nodes:  No cervical adenopathy   Psych:  Pleasant , calm, cooperative.  Fluid speech.    Intake/Output from previous day: No intake/output data recorded. Intake/Output this shift: No intake/output data recorded.  LAB RESULTS: Recent Labs    07/10/18 0033  WBC 6.1  HGB 14.9  HCT 43.3  PLT 209   BMET Lab Results   Component Value Date   NA 136 07/10/2018   NA 137 05/20/2016   NA 136 03/16/2016   K 3.9 07/10/2018   K 3.9 05/20/2016   K 3.7 03/16/2016   CL 103 07/10/2018   CL 98 (L) 05/20/2016   CL 104 03/16/2016   CO2 25 07/10/2018   CO2 22 03/16/2016   GLUCOSE 137 (H) 07/10/2018   GLUCOSE 128 (H) 05/20/2016   GLUCOSE 126 (H) 03/16/2016   BUN  12 07/10/2018   BUN 12 05/20/2016   BUN 17 03/16/2016   CREATININE 0.73 07/10/2018   CREATININE 0.70 05/20/2016   CREATININE 0.97 03/16/2016   CALCIUM 9.4 07/10/2018   CALCIUM 9.6 03/16/2016   LFT Recent Labs    07/10/18 0033  PROT 8.1  ALBUMIN 4.9  AST 586*  ALT 733*  ALKPHOS 114  BILITOT 3.4*   PT/INR No results found for: INR, PROTIME Hepatitis Panel No results for input(s): HEPBSAG, HCVAB, HEPAIGM, HEPBIGM in the last 72 hours. Lipase     Component Value Date/Time   LIPASE 31 07/10/2018 0033    Drugs of Abuse     Component Value Date/Time   LABOPIA NONE DETECTED 05/20/2016 0520   COCAINSCRNUR NONE DETECTED 05/20/2016 0520   LABBENZ NONE DETECTED 05/20/2016 0520   AMPHETMU NONE DETECTED 05/20/2016 0520   THCU NONE DETECTED 05/20/2016 0520   LABBARB NONE DETECTED 05/20/2016 0520     RADIOLOGY STUDIES: US Abdomen Complete  Result Date: 07/10/2018 CLINICAL DATA:  Initial evaluation for acute epigastric abdominal pain, elevated LFTs EXAM: ABDOMEN ULTRASOUND COMPLETE COMPARISON:  None. FINDINGS: Gallbladder: Multiple shadowing stones present within the gallbladder lumen measuring up to 7 mm in size. Gallbladder wall measure within normal limits at 2.2 mm. No free pericholecystic fluid. No sonographic Murphy sign elicited on exam. Common bile duct: Diameter: 5 mm Liver: No focal lesion identified. Within normal limits in parenchymal echogenicity. Portal vein is patent on color Doppler imaging with normal direction of blood flow towards the liver. IVC: No abnormality visualized. Pancreas: Visualized portion unremarkable. Spleen:  Size and appearance within normal limits. Right Kidney: Length: 11.6 cm. Echogenicity within normal limits. No mass or hydronephrosis visualized. Left Kidney: Length: 11.2 cm. Echogenicity within normal limits. No mass or hydronephrosis visualized. Abdominal aorta: No aneurysm visualized. Other findings: None. IMPRESSION: 1. Cholelithiasis without sonographic features for acute cholecystitis. 2. No biliary dilatation. 3. Otherwise normal abdominal ultrasound. Electronically Signed   By: Rise Mu M.D.   On: 07/10/2018 06:33     IMPRESSION:   *   Symptomatic cholecystitis.   Elevated LFTs may soley be due to cholelithiasis, cholecystitis but MRCP oredered to r/o choledocholithiasis (less likeley given 5 mm CBD).   Dr Carolynne Edouard has seen pt.  *   Hyperglycemia, informed pt of this.      PLAN:     *   Await results of to be completed MRCP.   Needs 4 to 6 hours NPO for this but afterwards ok to have clears, he is asking about food/water.    *   LFTs in AM.     Jennye Moccasin  07/10/2018, 8:56 AM Phone [phone]

## 2018-07-10 NOTE — H&P (Signed)
Jake Arias is an 27 y.o. male.   Chief Complaint: abd pain HPI: The patient is a 27 year old hispanic male who presents with abdominal pain that started Thursday night. Pain has been severe. Pain is associated with nausea but no vomiting. U/s shows gallstones and lft's elevated  History reviewed. No pertinent past medical history.  History reviewed. No pertinent surgical history.  No family history on file. Social History:  reports that he has never smoked. He has never used smokeless tobacco. He reports that he drinks alcohol. He reports that he does not use drugs.  Allergies: No Known Allergies   (Not in a hospital admission)  Results for orders placed or performed during the hospital encounter of 07/10/18 (from the past 48 hour(s))  Urinalysis, Routine w reflex microscopic     Status: Abnormal   Collection Time: 07/09/18 11:04 PM  Result Value Ref Range   Color, Urine YELLOW YELLOW   APPearance TURBID (A) CLEAR   Specific Gravity, Urine 1.018 1.005 - 1.030   pH 7.0 5.0 - 8.0   Glucose, UA NEGATIVE NEGATIVE mg/dL   Hgb urine dipstick SMALL (A) NEGATIVE   Bilirubin Urine NEGATIVE NEGATIVE   Ketones, ur 5 (A) NEGATIVE mg/dL   Protein, ur NEGATIVE NEGATIVE mg/dL   Nitrite NEGATIVE NEGATIVE   Leukocytes, UA NEGATIVE NEGATIVE   RBC / HPF 0-5 0 - 5 RBC/hpf   WBC, UA 0-5 0 - 5 WBC/hpf   Bacteria, UA NONE SEEN NONE SEEN   Amorphous Crystal PRESENT     Comment: Performed at [facility], [place] [address] [place], [place] [address]  Lipase, blood     Status: None   Collection Time: 07/10/18 12:33 AM  Result Value Ref Range   Lipase 31 11 - 51 U/L    Comment: Performed at [facility], [place] [address] [place], [place] [address]  Comprehensive metabolic panel     Status: Abnormal   Collection Time: 07/10/18 12:33 AM  Result Value Ref Range   Sodium 136 135 - 145 mmol/L   Potassium 3.9 3.5 - 5.1 mmol/L   Chloride 103 98 - 111 mmol/L     Comment: Please note change in reference range.   CO2 25 22 - 32 mmol/L   Glucose, Bld 137 (H) 70 - 99 mg/dL    Comment: Please note change in reference range.   BUN 12 6 - 20 mg/dL    Comment: Please note change in reference range.   Creatinine, Ser 0.73 0.61 - 1.24 mg/dL   Calcium 9.4 8.9 - 10.3 mg/dL   Total Protein 8.1 6.5 - 8.1 g/dL   Albumin 4.9 3.5 - 5.0 g/dL   AST 586 (H) 15 - 41 U/L   ALT 733 (H) 0 - 44 U/L    Comment: Please note change in reference range.   Alkaline Phosphatase 114 38 - 126 U/L   Total Bilirubin 3.4 (H) 0.3 - 1.2 mg/dL   GFR calc non Af Amer >60 >60 mL/min   GFR calc Af Amer >60 >60 mL/min    Comment: (NOTE) The eGFR has been calculated using the CKD EPI equation. This calculation has not been validated in all clinical situations. eGFR's persistently <60 mL/min signify possible Chronic Kidney Disease.    Anion gap 8 5 - 15    Comment: Performed at [facility], [place] [address] [place],  [address]  CBC     Status: None   Collection  Time: 07/10/18 12:33 AM  Result Value Ref Range   WBC 6.1 4.0 - 10.5 K/uL   RBC 4.68 4.22 - 5.81 MIL/uL   Hemoglobin 14.9 13.0 - 17.0 g/dL   HCT 43.3 39.0 - 52.0 %   MCV 92.5 78.0 - 100.0 fL   MCH 31.8 26.0 - 34.0 pg   MCHC 34.4 30.0 - 36.0 g/dL   RDW 12.5 11.5 - 15.5 %   Platelets 209 150 - 400 K/uL    Comment: Performed at [facility], [place] [address], [place], [place] [address]  Ethanol     Status: None   Collection Time: 07/10/18  3:09 AM  Result Value Ref Range   Alcohol, Ethyl (B) <10 <10 mg/dL    Comment: (NOTE) Lowest detectable limit for serum alcohol is 10 mg/dL. For medical purposes only. Performed at [facility], [place] [address] [place], [place] [address]   Acetaminophen level     Status: Abnormal   Collection Time: 07/10/18  3:09 AM  Result Value Ref Range   Acetaminophen (Tylenol), Serum <10 (L) 10 - 30 ug/mL    Comment:  (NOTE) Therapeutic concentrations vary significantly. A range of 10-30 ug/mL  may be an effective concentration for many patients. However, some  are best treated at concentrations outside of this range. Acetaminophen concentrations >150 ug/mL at 4 hours after ingestion  and >50 ug/mL at 12 hours after ingestion are often associated with  toxic reactions. Performed at [facility], [place] [address], [place], [place] [address]    US Abdomen Complete  Result Date: 07/10/2018 CLINICAL DATA:  Initial evaluation for acute epigastric abdominal pain, elevated LFTs EXAM: ABDOMEN ULTRASOUND COMPLETE COMPARISON:  None. FINDINGS: Gallbladder: Multiple shadowing stones present within the gallbladder lumen measuring up to 7 mm in size. Gallbladder wall measure within normal limits at 2.2 mm. No free pericholecystic fluid. No sonographic Murphy sign elicited on exam. Common bile duct: Diameter: 5 mm Liver: No focal lesion identified. Within normal limits in parenchymal echogenicity. Portal vein is patent on color Doppler imaging with normal direction of blood flow towards the liver. IVC: No abnormality visualized. Pancreas: Visualized portion unremarkable. Spleen: Size and appearance within normal limits. Right Kidney: Length: 11.6 cm. Echogenicity within normal limits. No mass or hydronephrosis visualized. Left Kidney: Length: 11.2 cm. Echogenicity within normal limits. No mass or hydronephrosis visualized. Abdominal aorta: No aneurysm visualized. Other findings: None. IMPRESSION: 1. Cholelithiasis without sonographic features for acute cholecystitis. 2. No biliary dilatation. 3. Otherwise normal abdominal ultrasound. Electronically Signed   By: Jeannine Boga M.D.   On: 07/10/2018 06:33    Review of Systems  Constitutional: Negative.   HENT: Negative.   Eyes: Negative.   Respiratory: Negative.   Cardiovascular: Negative.   Gastrointestinal: Positive for abdominal pain, nausea and  vomiting.  Genitourinary: Negative.   Musculoskeletal: Negative.   Skin: Negative.   Neurological: Negative.   Endo/Heme/Allergies: Negative.   Psychiatric/Behavioral: Negative.     Blood pressure 128/79, pulse 87, temperature 99.3 F (37.4 C), temperature source Oral, resp. rate 18, SpO2 97 %. Physical Exam  Constitutional: He is oriented to person, place, and time. He appears well-developed and well-nourished. No distress.  HENT:  Head: Normocephalic and atraumatic.  Mouth/Throat: No oropharyngeal exudate.  Eyes: Pupils are equal, round, and reactive to light. Conjunctivae and EOM are normal.  Neck: Normal range of motion. Neck supple.  Cardiovascular: Normal rate, regular rhythm and normal heart sounds.  Respiratory: Effort normal and  breath sounds normal. No stridor. No respiratory distress.  GI: Soft. Bowel sounds are normal. There is tenderness.  Musculoskeletal: Normal range of motion. He exhibits no edema or tenderness.  Neurological: He is alert and oriented to person, place, and time. Coordination normal.  Skin: Skin is warm and dry. No rash noted.  Psychiatric: He has a normal mood and affect. His behavior is normal. Thought content normal.     Assessment/Plan The patient appears to have gallstones and elevated lft's worrisome for cbd stones. Will consult GI and plan for MRCP. He will likely benefit from removal of gallbladder during this admission once the lft's improve  TOTH III, S, MD 07/10/2018, 9:02 AM

## 2018-07-10 NOTE — Consult Note (Signed)
                                                                            Gastroenterology Consult: 8:56 AM 07/10/2018  LOS: 0 days    Referring Provider: Dr Palumbo  Primary Care Physician:  Patient, No Pcp Per Primary Gastroenterologist:  unassigned     Reason for Consultation:  Abdominal pain, gallstones,    HPI: Quinlin Leon Ortiz is a 27 y.o. male.  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  Episodes of right upper quadrant and flank pain prompting a few visits to the ED in 2017.  Ultrasound then showed uncomplicated gallstones, LFTs normal white count elevated to 13.  Working diagnosis was biliary colic versus renal colic versus gastritis versus pancreatitis.  Patient did not have suggested surgical follow-up.    Starting Thursday night after eating a sandwich he developed pain in his right upper quadrant, no nausea, no jaundice.  Pain did radiate to the scapular area.  He had a prescription for Protonix which she took but did not help with the pain.  He presented to the ED early this morning.  Pain improved following 4 mg morphine.  Last ate Thursday night, currently hungry and would love to have some water.  T bili 3.4.  Alkaline phosphatase 114.  AST/ALT 586/733. No anemia, no elevated white count.  Platelets normal. APAP level less than 10. Glucose 137. Abdominal ultrasound: uncomplicated cholelithiasis.  No gallbladder wall thickening or pericholecystic fluid.  CBD 5mm. MRCP ordered.  Patient drinks 6-12 beers every couple of weeks but does not drink on a daily basis. Family history negative for gallbladder, pancreas, peptic ulcer disease or gastrointestinal cancers.  He knows of an uncle who has diabetes.   History reviewed. No pertinent past medical history.  History reviewed. No pertinent surgical history.  Prior to Admission medications   Medication Sig  Start Date End Date Taking? Authorizing Provider  pantoprazole (PROTONIX) 20 MG tablet Take 20 mg by mouth daily.   Yes [provider]    Scheduled Meds:  Infusions:  PRN Meds:    Allergies as of 07/09/2018  . (No Known Allergies)    No family history on file.  Social History   Socioeconomic History  . Marital status: Single    Spouse name: Not on file  . Number of children: Not on file  . Years of education: Not on file  . Highest education level: Not on file  Occupational History  . Not on file  Social Needs  . Financial resource strain: Not on file  . Food insecurity:    Worry: Not on file    Inability: Not on file  . Transportation needs:    Medical: Not on file    Non-medical: Not on file  Tobacco Use  . Smoking status: Never Smoker  . Smokeless tobacco: Never Used  Substance and Sexual Activity  . Alcohol use: Yes    Comment: socially  . Drug use: No  . Sexual activity: Not on file  Lifestyle  . Physical activity:    Days per week: Not on file    Minutes per session: Not on file  . Stress: Not on   file  Relationships  . Social connections:    Talks on phone: Not on file    Gets together: Not on file    Attends religious service: Not on file    Active member of club or organization: Not on file    Attends meetings of clubs or organizations: Not on file    Relationship status: Not on file  . Intimate partner violence:    Fear of current or ex partner: Not on file    Emotionally abused: Not on file    Physically abused: Not on file    Forced sexual activity: Not on file  Other Topics Concern  . Not on file  Social History Narrative  . Not on file    REVIEW OF SYSTEMS: Constitutional: No weakness, no fatigue. ENT:  No nose bleeds Pulm: No shortness of breath.  No cough. CV:  No palpitations, no LE edema.  GU:  No hematuria, no frequency.  Skin treated and very yellow compared to normal. GI: No dysphagia.  No altered bowel  habits. Heme: No issues with excessive or unusual bleeding or bruising. Transfusions: Never. Neuro:  No headaches, no peripheral tingling or numbness.   No dizziness, no syncope. Derm:  No itching, no rash or sores.  No jaundice. Endocrine:  No sweats or chills.  No polyuria or dysuria Immunization: Did not inquire as to recent or previous vaccinations. Travel:  None beyond local counties in last few months.    PHYSICAL EXAM: Vital signs in last 24 hours: Vitals:   07/10/18 0700 07/10/18 0830  BP: (!) 103/55 128/79  Pulse: 65 87  Resp: 16 18  Temp:    SpO2: 99% 97%   Wt Readings from Last 3 Encounters:  03/16/16 188 lb 1 oz (85.3 kg)    General: Pleasant, looks well and comfortable.  Interviewed using the video-assisted translator. Head: No facial asymmetry, signs of head trauma or swelling. Eyes: Icterus, no conjunctival pallor.  EOMI. Ears: Not HOH. Nose: No congestion or discharge. Mouth: Good dentition.  Moist, pink, clear oral mucosa.  Tongue midline. Neck: No JVD, no masses, no thyromegaly. Lungs: Clear bilaterally no labored breathing or cough. Heart: RRR.  No MRG.  S1, S2 present Abdomen: Soft.  Nondistended.  Minimal tenderness in the right upper quadrant without guarding or rebound.  No HSM, bruits, hernias, masses.  Bowel sounds active.   Rectal: Deferred Musc/Skeltl: No joint redness, swelling or significant deformity Extremities: No CCE. Neurologic: Alert.  Oriented x3.  Moves all 4 limbs, strength not tested.  No tremor or gross neurologic deficits. Skin: No obvious jaundice, could be missed given he is Hispanic and has olive complexion. Nodes:  No cervical adenopathy   Psych:  Pleasant , calm, cooperative.  Fluid speech.    Intake/Output from previous day: No intake/output data recorded. Intake/Output this shift: No intake/output data recorded.  LAB RESULTS: Recent Labs    07/10/18 0033  WBC 6.1  HGB 14.9  HCT 43.3  PLT 209   BMET Lab Results   Component Value Date   NA 136 07/10/2018   NA 137 05/20/2016   NA 136 03/16/2016   K 3.9 07/10/2018   K 3.9 05/20/2016   K 3.7 03/16/2016   CL 103 07/10/2018   CL 98 (L) 05/20/2016   CL 104 03/16/2016   CO2 25 07/10/2018   CO2 22 03/16/2016   GLUCOSE 137 (H) 07/10/2018   GLUCOSE 128 (H) 05/20/2016   GLUCOSE 126 (H) 03/16/2016   BUN   12 07/10/2018   BUN 12 05/20/2016   BUN 17 03/16/2016   CREATININE 0.73 07/10/2018   CREATININE 0.70 05/20/2016   CREATININE 0.97 03/16/2016   CALCIUM 9.4 07/10/2018   CALCIUM 9.6 03/16/2016   LFT Recent Labs    07/10/18 0033  PROT 8.1  ALBUMIN 4.9  AST 586*  ALT 733*  ALKPHOS 114  BILITOT 3.4*   PT/INR No results found for: INR, PROTIME Hepatitis Panel No results for input(s): HEPBSAG, HCVAB, HEPAIGM, HEPBIGM in the last 72 hours. Lipase     Component Value Date/Time   LIPASE 31 07/10/2018 0033    Drugs of Abuse     Component Value Date/Time   LABOPIA NONE DETECTED 05/20/2016 0520   COCAINSCRNUR NONE DETECTED 05/20/2016 0520   LABBENZ NONE DETECTED 05/20/2016 0520   AMPHETMU NONE DETECTED 05/20/2016 0520   THCU NONE DETECTED 05/20/2016 0520   LABBARB NONE DETECTED 05/20/2016 0520     RADIOLOGY STUDIES: Us Abdomen Complete  Result Date: 07/10/2018 CLINICAL DATA:  Initial evaluation for acute epigastric abdominal pain, elevated LFTs EXAM: ABDOMEN ULTRASOUND COMPLETE COMPARISON:  None. FINDINGS: Gallbladder: Multiple shadowing stones present within the gallbladder lumen measuring up to 7 mm in size. Gallbladder wall measure within normal limits at 2.2 mm. No free pericholecystic fluid. No sonographic Murphy sign elicited on exam. Common bile duct: Diameter: 5 mm Liver: No focal lesion identified. Within normal limits in parenchymal echogenicity. Portal vein is patent on color Doppler imaging with normal direction of blood flow towards the liver. IVC: No abnormality visualized. Pancreas: Visualized portion unremarkable. Spleen:  Size and appearance within normal limits. Right Kidney: Length: 11.6 cm. Echogenicity within normal limits. No mass or hydronephrosis visualized. Left Kidney: Length: 11.2 cm. Echogenicity within normal limits. No mass or hydronephrosis visualized. Abdominal aorta: No aneurysm visualized. Other findings: None. IMPRESSION: 1. Cholelithiasis without sonographic features for acute cholecystitis. 2. No biliary dilatation. 3. Otherwise normal abdominal ultrasound. Electronically Signed   By: Benjamin  McClintock M.D.   On: 07/10/2018 06:33     IMPRESSION:   *   Symptomatic cholecystitis.   Elevated LFTs may soley be due to cholelithiasis, cholecystitis but MRCP oredered to r/o choledocholithiasis (less likeley given 5 mm CBD).   Dr Toth has seen pt.  *   Hyperglycemia, informed pt of this.      PLAN:     *   Await results of to be completed MRCP.   Needs 4 to 6 hours NPO for this but afterwards ok to have clears, he is asking about food/water.    *   LFTs in AM.     Morgane Joerger  07/10/2018, 8:56 AM Phone [phone]     

## 2018-07-10 NOTE — ED Provider Notes (Signed)
[facility]-EMERGENCY DEPT Provider Note   CSN: [id number] Arrival date & time: 07/09/18  2256     History   Chief Complaint Chief Complaint  Patient presents with  . Flank Pain  . Abdominal Pain    HPI Jake Arias is a 27 y.o. male with a hx of no major medical problems presents to the Emergency Department complaining of gradual, persistent, progressively worsening epigastric and RUQ abd pain onset last night after eating a sandwich with Jalapenos.  Patient reports the pain improved overnight but returned again this morning and became constant.  Patient reports the pain is a 10/10 located in the epigastrium and right upper quadrant.  He reports it radiates to his right flank.  Movement makes his symptoms worse.  He took pantoprazole and acetaminophen for pain without relief.  Pt reports social EtOH consumption several times per month.  He denies drug usage, specifically IV drug usage.  Pt reports his appetite has otherwise been normal.  He denies regular NSAID usage, aspirin or Goody powders.  He he denies history of peptic ulcer disease.  He denies previous abdominal surgeries.  Patient reports associated nausea without vomiting or diarrhea.  No dysuria or hematuria.     The history is provided by the patient and medical records. No language interpreter was used.    History reviewed. No pertinent past medical history.  There are no active problems to display for this patient.   History reviewed. No pertinent surgical history.      Home Medications    Prior to Admission medications   Medication Sig Start Date End Date Taking? Authorizing Provider  pantoprazole (PROTONIX) 20 MG tablet Take 20 mg by mouth daily.   Yes [provider]    Family History No family history on file.  Social History Social History   Tobacco Use  . Smoking status: Never Smoker  . Smokeless tobacco: Never Used  Substance Use Topics  . Alcohol use: Yes   Comment: socially  . Drug use: No     Allergies   Patient has no known allergies.   Review of Systems Review of Systems  Constitutional: Negative for appetite change, diaphoresis, fatigue, fever and unexpected weight change.  HENT: Negative for mouth sores.   Eyes: Negative for visual disturbance.  Respiratory: Negative for cough, chest tightness, shortness of breath and wheezing.   Cardiovascular: Negative for chest pain.  Gastrointestinal: Positive for abdominal pain and nausea. Negative for constipation, diarrhea and vomiting.  Endocrine: Negative for polydipsia, polyphagia and polyuria.  Genitourinary: Positive for flank pain. Negative for dysuria, frequency, hematuria and urgency.  Musculoskeletal: Negative for back pain and neck stiffness.  Skin: Negative for rash.  Allergic/Immunologic: Negative for immunocompromised state.  Neurological: Negative for syncope, light-headedness and headaches.  Hematological: Does not bruise/bleed easily.  Psychiatric/Behavioral: Negative for sleep disturbance. The patient is not nervous/anxious.      Physical Exam Updated Vital Signs BP (!) 123/92 (BP Location: Right Arm)   Pulse 73   Temp 99.3 F (37.4 C) (Oral)   Resp (!) 24   SpO2 100%   Physical Exam  Constitutional: He appears well-developed and well-nourished. No distress.  Awake, alert, nontoxic appearance  HENT:  Head: Normocephalic and atraumatic.  Mouth/Throat: Oropharynx is clear and moist. No oropharyngeal exudate.  Eyes: Conjunctivae are normal. No scleral icterus.  Neck: Normal range of motion. Neck supple.  Cardiovascular: Normal rate, regular rhythm and intact distal pulses.  Pulmonary/Chest: Effort normal and breath sounds  normal. Tachypnea noted. No respiratory distress. He has no wheezes.  Equal chest expansion  Abdominal: Soft. Bowel sounds are normal. He exhibits no mass. There is tenderness in the right upper quadrant and epigastric area. There is guarding,  CVA tenderness ( right) and positive Murphy's sign. There is no rigidity, no rebound and no tenderness at McBurney's point.  Musculoskeletal: Normal range of motion. He exhibits no edema.  Neurological: He is alert.  Speech is clear and goal oriented Moves extremities without ataxia  Skin: Skin is warm and dry. He is not diaphoretic.  Psychiatric: He has a normal mood and affect.  Nursing note and vitals reviewed.    ED Treatments / Results  Labs (all labs ordered are listed, but only abnormal results are displayed) Labs Reviewed  COMPREHENSIVE METABOLIC PANEL - Abnormal; Notable for the following components:      Result Value   Glucose, Bld 137 (*)    AST 586 (*)    ALT 733 (*)    Total Bilirubin 3.4 (*)    All other components within normal limits  URINALYSIS, ROUTINE W REFLEX MICROSCOPIC - Abnormal; Notable for the following components:   APPearance TURBID (*)    Hgb urine dipstick SMALL (*)    Ketones, ur 5 (*)    All other components within normal limits  ACETAMINOPHEN LEVEL - Abnormal; Notable for the following components:   Acetaminophen (Tylenol), Serum <10 (*)    All other components within normal limits  LIPASE, BLOOD  CBC  ETHANOL    EKG None  Radiology US Abdomen Complete  Result Date: 07/10/2018 CLINICAL DATA:  Initial evaluation for acute epigastric abdominal pain, elevated LFTs EXAM: ABDOMEN ULTRASOUND COMPLETE COMPARISON:  None. FINDINGS: Gallbladder: Multiple shadowing stones present within the gallbladder lumen measuring up to 7 mm in size. Gallbladder wall measure within normal limits at 2.2 mm. No free pericholecystic fluid. No sonographic Murphy sign elicited on exam. Common bile duct: Diameter: 5 mm Liver: No focal lesion identified. Within normal limits in parenchymal echogenicity. Portal vein is patent on color Doppler imaging with normal direction of blood flow towards the liver. IVC: No abnormality visualized. Pancreas: Visualized portion  unremarkable. Spleen: Size and appearance within normal limits. Right Kidney: Length: 11.6 cm. Echogenicity within normal limits. No mass or hydronephrosis visualized. Left Kidney: Length: 11.2 cm. Echogenicity within normal limits. No mass or hydronephrosis visualized. Abdominal aorta: No aneurysm visualized. Other findings: None. IMPRESSION: 1. Cholelithiasis without sonographic features for acute cholecystitis. 2. No biliary dilatation. 3. Otherwise normal abdominal ultrasound. Electronically Signed   By: Rise Mu M.D.   On: 07/10/2018 06:33    Procedures Procedures (including critical care time)  Medications Ordered in ED Medications  morphine 4 MG/ML injection 4 mg (4 mg Intravenous Given 07/10/18 0340)     Initial Impression / Assessment and Plan / ED Course  I have reviewed the triage vital signs and the nursing notes.  Pertinent labs & imaging results that were available during my care of the patient were reviewed by me and considered in my medical decision making (see chart for details).  Clinical Course as of Jul 10 716  Sat Jul 10, 2018  0455 Elevated  AST(!): 586 [HM]  0455 No leukocytosis  WBC: 6.1 [HM]  0455 No urinary tract infection  Ketones, ur(!): 5 [HM]  0455 Significantly elevated  Total Bilirubin(!): 3.4 [HM]    Clinical Course User Index [HM] Bosco Paparella, Boyd Kerbs     Patient presents emergency department with  severe right upper quadrant abdominal pain that radiates to his right back.  Elevated transaminases and total bili.  Cholelithiasis is noted on ultrasound.  No specific sonographic Eulah PontMurphy sign however patient has exquisite tenderness to palpation in the right upper quadrant and difficult to control pain.  No vomiting here.  Patient is n.p.o.  Will need surgical evaluation and admission for this.  The patient was discussed with and seen by Dr. Nicanor AlconPalumbo who agrees with the treatment plan.  Discussed with Dr. Carolynne Edouardoth.He requests GI consult  and will plan to admit.    Discussed with Dr. Rhea BeltonPyrtle who will evaluate.  Final Clinical Impressions(s) / ED Diagnoses   Final diagnoses:  Transaminitis  Calculus of gallbladder with acute cholecystitis and obstruction  Right upper quadrant abdominal pain    ED Discharge Orders    None       Milta DeitersMuthersbaugh, Arcadia Gorgas, PA-C 07/10/18 [id number]    Palumbo, April, MD 07/10/18 [phone]

## 2018-07-10 NOTE — ED Notes (Signed)
MD at bedside. 

## 2018-07-10 NOTE — ED Notes (Signed)
ED TO INPATIENT HANDOFF REPORT  Name/Age/Gender Jake Arias 27 y.o. male  Code Status   Home/SNF/Other Home  Chief Complaint abd pain / back pain   Level of Care/Admitting Diagnosis ED Disposition    ED Disposition Condition [facility] Area: [facility] [[id number]]  Level of Care: Med-Surg [16]  Diagnosis: Cholelithiasis [010932]  Admitting Physician: Jovita Kussmaul [phone]  Attending Physician: CCS, MD [[id number]]  Estimated length of stay: 3 - 4 days  Certification:: I certify this patient will need inpatient services for at least 2 midnights  PT Class (Do Not Modify): Inpatient [101]  PT Acc Code (Do Not Modify): Private [1]       Medical History History reviewed. No pertinent past medical history.  Allergies No Known Allergies  IV Location/Drains/Wounds Patient Lines/Drains/Airways Status   Active Line/Drains/Airways    Name:   Placement date:   Placement time:   Site:   Days:   Peripheral IV 05/20/16 Right Forearm   05/20/16    0501    Forearm   781   Peripheral IV 07/10/18 Left Antecubital   07/10/18    0132    Antecubital   less than 1          Labs/Imaging Results for orders placed or performed during the hospital encounter of 07/10/18 (from the past 48 hour(s))  Urinalysis, Routine w reflex microscopic     Status: Abnormal   Collection Time: 07/09/18 11:04 PM  Result Value Ref Range   Color, Urine YELLOW YELLOW   APPearance TURBID (A) CLEAR   Specific Gravity, Urine 1.018 1.005 - 1.030   pH 7.0 5.0 - 8.0   Glucose, UA NEGATIVE NEGATIVE mg/dL   Hgb urine dipstick SMALL (A) NEGATIVE   Bilirubin Urine NEGATIVE NEGATIVE   Ketones, ur 5 (A) NEGATIVE mg/dL   Protein, ur NEGATIVE NEGATIVE mg/dL   Nitrite NEGATIVE NEGATIVE   Leukocytes, UA NEGATIVE NEGATIVE   RBC / HPF 0-5 0 - 5 RBC/hpf   WBC, UA 0-5 0 - 5 WBC/hpf   Bacteria, UA NONE SEEN NONE SEEN   Amorphous Crystal PRESENT     Comment: Performed at [facility], [place] [address], [place], [place] [address]  Lipase, blood     Status: None   Collection Time: 07/10/18 12:33 AM  Result Value Ref Range   Lipase 31 11 - 51 U/L    Comment: Performed at [facility], [place] [address], [place], [place] [address]  Comprehensive metabolic panel     Status: Abnormal   Collection Time: 07/10/18 12:33 AM  Result Value Ref Range   Sodium 136 135 - 145 mmol/L   Potassium 3.9 3.5 - 5.1 mmol/L   Chloride 103 98 - 111 mmol/L    Comment: Please note change in reference range.   CO2 25 22 - 32 mmol/L   Glucose, Bld 137 (H) 70 - 99 mg/dL    Comment: Please note change in reference range.   BUN 12 6 - 20 mg/dL    Comment: Please note change in reference range.   Creatinine, Ser 0.73 0.61 - 1.24 mg/dL   Calcium 9.4 8.9 - 10.3 mg/dL   Total Protein 8.1 6.5 - 8.1 g/dL   Albumin 4.9 3.5 - 5.0 g/dL   AST 586 (H) 15 - 41 U/L   ALT 733 (H) 0 - 44 U/L    Comment: Please note change in reference range.   Alkaline  Phosphatase 114 38 - 126 U/L   Total Bilirubin 3.4 (H) 0.3 - 1.2 mg/dL   GFR calc non Af Amer >60 >60 mL/min   GFR calc Af Amer >60 >60 mL/min    Comment: (NOTE) The eGFR has been calculated using the CKD EPI equation. This calculation has not been validated in all clinical situations. eGFR's persistently <60 mL/min signify possible Chronic Kidney Disease.    Anion gap 8 5 - 15    Comment: Performed at [facility], [place] [address] [place], [place] [address]  CBC     Status: None   Collection Time: 07/10/18 12:33 AM  Result Value Ref Range   WBC 6.1 4.0 - 10.5 K/uL   RBC 4.68 4.22 - 5.81 MIL/uL   Hemoglobin 14.9 13.0 - 17.0 g/dL   HCT 43.3 39.0 - 52.0 %   MCV 92.5 78.0 - 100.0 fL   MCH 31.8 26.0 - 34.0 pg   MCHC 34.4 30.0 - 36.0 g/dL   RDW 12.5 11.5 - 15.5 %   Platelets 209 150 - 400 K/uL    Comment: Performed at [facility], [place] [address], [place], [place] [address]   Ethanol     Status: None   Collection Time: 07/10/18  3:09 AM  Result Value Ref Range   Alcohol, Ethyl (B) <10 <10 mg/dL    Comment: (NOTE) Lowest detectable limit for serum alcohol is 10 mg/dL. For medical purposes only. Performed at [facility], [place] [address], [place], [place] [address]   Acetaminophen level     Status: Abnormal   Collection Time: 07/10/18  3:09 AM  Result Value Ref Range   Acetaminophen (Tylenol), Serum <10 (L) 10 - 30 ug/mL    Comment: (NOTE) Therapeutic concentrations vary significantly. A range of 10-30 ug/mL  may be an effective concentration for many patients. However, some  are best treated at concentrations outside of this range. Acetaminophen concentrations >150 ug/mL at 4 hours after ingestion  and >50 ug/mL at 12 hours after ingestion are often associated with  toxic reactions. Performed at [facility], [place] [address] [place], [place] [address]    US Abdomen Complete  Result Date: 07/10/2018 CLINICAL DATA:  Initial evaluation for acute epigastric abdominal pain, elevated LFTs EXAM: ABDOMEN ULTRASOUND COMPLETE COMPARISON:  None. FINDINGS: Gallbladder: Multiple shadowing stones present within the gallbladder lumen measuring up to 7 mm in size. Gallbladder wall measure within normal limits at 2.2 mm. No free pericholecystic fluid. No sonographic Murphy sign elicited on exam. Common bile duct: Diameter: 5 mm Liver: No focal lesion identified. Within normal limits in parenchymal echogenicity. Portal vein is patent on color Doppler imaging with normal direction of blood flow towards the liver. IVC: No abnormality visualized. Pancreas: Visualized portion unremarkable. Spleen: Size and appearance within normal limits. Right Kidney: Length: 11.6 cm. Echogenicity within normal limits. No mass or hydronephrosis visualized. Left Kidney: Length: 11.2 cm. Echogenicity within normal limits. No mass or hydronephrosis visualized.  Abdominal aorta: No aneurysm visualized. Other findings: None. IMPRESSION: 1. Cholelithiasis without sonographic features for acute cholecystitis. 2. No biliary dilatation. 3. Otherwise normal abdominal ultrasound. Electronically Signed   By: Jeannine Boga M.D.   On: 07/10/2018 06:33    Pending Labs Unresulted Labs (From admission, onward)   Start     Ordered   07/11/18 0500  Comprehensive metabolic panel  Tomorrow morning,   R     07/10/18 0936   Signed and Held  HIV antibody (Routine Testing)  Once,   R     Signed and Held   Signed and Held  Comprehensive metabolic panel  Tomorrow morning,   R     Signed and Held   Signed and Held  CBC  Tomorrow morning,   R     Signed and Held      Vitals/Pain Today's Vitals   07/10/18 1100 07/10/18 1130 07/10/18 1200 07/10/18 1230  BP: 122/75 125/70 (!) 101/57 131/77  Pulse: 61 70 66 95  Resp: 15 15 16 13   Temp:      TempSrc:      SpO2: 94% 95% 96% 97%  PainSc:        Isolation Precautions No active isolations  Medications Medications  morphine 4 MG/ML injection 4 mg (4 mg Intravenous Given 07/10/18 0340)    Mobility walks

## 2018-07-11 ENCOUNTER — Encounter (HOSPITAL_COMMUNITY): Admission: EM | Disposition: A | Discharge status: Home / Self Care | Payer: Self-pay | Source: Home / Self Care

## 2018-07-11 ENCOUNTER — Inpatient Hospital Stay (HOSPITAL_COMMUNITY): Payer: Self-pay | Admitting: Anesthesiology

## 2018-07-11 ENCOUNTER — Encounter (HOSPITAL_COMMUNITY): Payer: Self-pay | Admitting: Certified Registered"

## 2018-07-11 ENCOUNTER — Inpatient Hospital Stay (HOSPITAL_COMMUNITY): Payer: Self-pay

## 2018-07-11 DIAGNOSIS — K8051 Calculus of bile duct without cholangitis or cholecystitis with obstruction: Secondary | ICD-10-CM

## 2018-07-11 HISTORY — PX: SPHINCTEROTOMY: SHX5544

## 2018-07-11 HISTORY — PX: PANCREATIC STENT PLACEMENT: SHX5539

## 2018-07-11 HISTORY — PX: ERCP: SHX5425

## 2018-07-11 HISTORY — PX: LITHOTRIPSY: SHX5546

## 2018-07-11 HISTORY — PX: REMOVAL OF STONES: SHX5545

## 2018-07-11 LAB — COMPREHENSIVE METABOLIC PANEL
ALBUMIN: 4.3 g/dL (ref 3.5–5.0)
ALK PHOS: 129 U/L — AB (ref 38–126)
ALT: 768 U/L — AB (ref 0–44)
AST: 320 U/L — AB (ref 15–41)
Anion gap: 5 (ref 5–15)
BILIRUBIN TOTAL: 6.7 mg/dL — AB (ref 0.3–1.2)
BUN: 7 mg/dL (ref 6–20)
CALCIUM: 9 mg/dL (ref 8.9–10.3)
CO2: 28 mmol/L (ref 22–32)
CREATININE: 0.64 mg/dL (ref 0.61–1.24)
Chloride: 102 mmol/L (ref 98–111)
GFR calc Af Amer: >60 mL/min (ref >60)
GFR calc non Af Amer: >60 mL/min (ref >60)
GLUCOSE: 143 mg/dL — AB (ref 70–99)
Potassium: 4 mmol/L (ref 3.5–5.1)
Sodium: 135 mmol/L (ref 135–145)
Total Protein: 7.3 g/dL (ref 6.5–8.1)

## 2018-07-11 LAB — CBC
HCT: 42.5 % (ref 39.0–52.0)
Hemoglobin: 14.8 g/dL (ref 13.0–17.0)
MCH: 32.3 pg (ref 26.0–34.0)
MCHC: 34.8 g/dL (ref 30.0–36.0)
MCV: 92.8 fL (ref 78.0–100.0)
PLATELETS: 184 10*3/uL (ref 150–400)
RBC: 4.58 MIL/uL (ref 4.22–5.81)
RDW: 12.7 % (ref 11.5–15.5)
WBC: 6.2 10*3/uL (ref 4.0–10.5)

## 2018-07-11 LAB — HIV ANTIBODY (ROUTINE TESTING W REFLEX): HIV Screen 4th Generation wRfx: NONREACTIVE

## 2018-07-11 SURGERY — ENDOSCOPIC RETROGRADE CHOLANGIOPANCREATOGRAPHY (ERCP) WITH PROPOFOL
Anesthesia: General

## 2018-07-11 SURGERY — ERCP, WITH INTERVENTION IF INDICATED
Anesthesia: General

## 2018-07-11 MED ORDER — SODIUM CHLORIDE 0.9 % IV SOLN
INTRAVENOUS | Status: DC | PRN
  Administered 2018-07-11: 28 mL

## 2018-07-11 MED ORDER — LACTATED RINGERS IV SOLN: INTRAVENOUS | Status: DC

## 2018-07-11 MED ORDER — LIDOCAINE 2% (20 MG/ML) 5 ML SYRINGE
INTRAMUSCULAR | Status: DC | PRN
  Administered 2018-07-11: 100 mg via INTRAVENOUS

## 2018-07-11 MED ORDER — SODIUM CHLORIDE 0.9 % IV SOLN: INTRAVENOUS | Status: DC

## 2018-07-11 MED ORDER — ROCURONIUM BROMIDE 10 MG/ML (PF) SYRINGE
PREFILLED_SYRINGE | INTRAVENOUS | Status: DC | PRN
  Administered 2018-07-11 (×2): 10 mg via INTRAVENOUS
  Administered 2018-07-11: 50 mg via INTRAVENOUS

## 2018-07-11 MED ORDER — FENTANYL CITRATE (PF) 100 MCG/2ML IJ SOLN: 25.0000 ug | INTRAMUSCULAR | Status: DC | PRN

## 2018-07-11 MED ORDER — SODIUM CHLORIDE 0.9 % IV SOLN
INTRAVENOUS | Status: DC | PRN
  Administered 2018-07-11: 250 mL via INTRAVENOUS

## 2018-07-11 MED ORDER — SUGAMMADEX SODIUM 200 MG/2ML IV SOLN
INTRAVENOUS | Status: DC | PRN
  Administered 2018-07-11: 175 mg via INTRAVENOUS

## 2018-07-11 MED ORDER — FENTANYL CITRATE (PF) 100 MCG/2ML IJ SOLN
INTRAMUSCULAR | Status: DC | PRN
  Administered 2018-07-11: 100 ug via INTRAVENOUS
  Administered 2018-07-11 (×3): 50 ug via INTRAVENOUS

## 2018-07-11 MED ORDER — MIDAZOLAM HCL 5 MG/5ML IJ SOLN
INTRAMUSCULAR | Status: DC | PRN
  Administered 2018-07-11: 2 mg via INTRAVENOUS

## 2018-07-11 MED ORDER — SODIUM CHLORIDE 0.9 % IV SOLN
3.0000 g | Freq: Once | INTRAVENOUS | Status: AC
  Administered 2018-07-11: 3 g via INTRAVENOUS
  Filled 2018-07-11: qty 3

## 2018-07-11 MED ORDER — KCL IN DEXTROSE-NACL 20-5-0.9 MEQ/L-%-% IV SOLN
INTRAVENOUS | Status: DC
  Administered 2018-07-11: 1000 mL via INTRAVENOUS
  Administered 2018-07-12 – 2018-07-13 (×2): via INTRAVENOUS
  Filled 2018-07-11 (×6): qty 1000

## 2018-07-11 MED ORDER — FAMOTIDINE IN NACL 20-0.9 MG/50ML-% IV SOLN
20.0000 mg | Freq: Two times a day (BID) | INTRAVENOUS | Status: DC
  Administered 2018-07-11 – 2018-07-13 (×4): 20 mg via INTRAVENOUS
  Filled 2018-07-11 (×4): qty 50

## 2018-07-11 MED ORDER — INDOMETHACIN 50 MG RE SUPP
100.0000 mg | Freq: Once | RECTAL | Status: DC
  Filled 2018-07-11: qty 2

## 2018-07-11 MED ORDER — GLUCAGON HCL RDNA (DIAGNOSTIC) 1 MG IJ SOLR
INTRAMUSCULAR | Status: DC | PRN
  Administered 2018-07-11 (×3): .5 mg via INTRAVENOUS

## 2018-07-11 MED ORDER — INDOMETHACIN 50 MG RE SUPP
RECTAL | Status: DC | PRN
  Administered 2018-07-11: 100 mg via RECTAL

## 2018-07-11 MED ORDER — MEPERIDINE HCL 25 MG/ML IJ SOLN: 6.2500 mg | INTRAMUSCULAR | Status: DC | PRN

## 2018-07-11 MED ORDER — FENTANYL CITRATE (PF) 250 MCG/5ML IJ SOLN
INTRAMUSCULAR | Status: AC
  Filled 2018-07-11: qty 5

## 2018-07-11 MED ORDER — MIDAZOLAM HCL 2 MG/2ML IJ SOLN
INTRAMUSCULAR | Status: AC
  Filled 2018-07-11: qty 2

## 2018-07-11 MED ORDER — PROPOFOL 10 MG/ML IV BOLUS
INTRAVENOUS | Status: AC
  Filled 2018-07-11: qty 20

## 2018-07-11 MED ORDER — ONDANSETRON HCL 4 MG/2ML IJ SOLN
INTRAMUSCULAR | Status: DC | PRN
  Administered 2018-07-11: 4 mg via INTRAVENOUS

## 2018-07-11 MED ORDER — LACTATED RINGERS IV SOLN
INTRAVENOUS | Status: DC | PRN
  Administered 2018-07-11 (×2): via INTRAVENOUS

## 2018-07-11 MED ORDER — PROPOFOL 10 MG/ML IV BOLUS
INTRAVENOUS | Status: DC | PRN
  Administered 2018-07-11: 180 mg via INTRAVENOUS

## 2018-07-11 MED ORDER — GLUCAGON HCL RDNA (DIAGNOSTIC) 1 MG IJ SOLR
INTRAMUSCULAR | Status: AC
  Filled 2018-07-11: qty 1

## 2018-07-11 MED ORDER — DEXAMETHASONE SODIUM PHOSPHATE 10 MG/ML IJ SOLN
INTRAMUSCULAR | Status: DC | PRN
  Administered 2018-07-11: 10 mg via INTRAVENOUS

## 2018-07-11 MED ORDER — PROMETHAZINE HCL 25 MG/ML IJ SOLN: 6.2500 mg | INTRAMUSCULAR | Status: DC | PRN

## 2018-07-11 MED ORDER — MORPHINE SULFATE (PF) 2 MG/ML IV SOLN
1.0000 mg | INTRAVENOUS | Status: DC | PRN
  Administered 2018-07-11 – 2018-07-12 (×3): 2 mg via INTRAVENOUS
  Filled 2018-07-11 (×3): qty 1

## 2018-07-11 NOTE — Op Note (Signed)
[facility] Patient Name: Jake Arias Procedure Date: 07/11/2018 MRN: [id number] Attending MD: Willis Modena , MD Date of Birth: 26-Jan-1991 CSN: [id number] Age: 27 Admit Type: Inpatient Procedure:                ERCP Indications:              Common bile duct stone(s), Abnormal abdominal MRI,                            Elevated liver enzymes Providers:                Willis Modena, MD, Janae Sauce. Steele Berg, RN, Arlee Muslim Tech., Technician, [facility] [place],                            CRNA Referring MD:              Medicines:                General Anesthesia, Unasyn 3 g IV, Indomethacin 100                            mg PR Complications:            No immediate complications. Estimated Blood Loss:     Estimated blood loss: none. Procedure:                Pre-Anesthesia Assessment:                           - Prior to the procedure, a History and Physical                            was performed, and patient medications and                            allergies were reviewed. The patient's tolerance of                            previous anesthesia was also reviewed. The risks                            and benefits of the procedure and the sedation                            options and risks were discussed with the patient.                            All questions were answered, and informed consent                            was obtained. Prior Anticoagulants: The patient has                            taken no previous  anticoagulant or antiplatelet                            agents. ASA Grade Assessment: II - A patient with                            mild systemic disease. After reviewing the risks                            and benefits, the patient was deemed in                            satisfactory condition to undergo the procedure.                           After obtaining informed consent, the scope was               passed under direct vision. Throughout the                            procedure, the patient's blood pressure, pulse, and                            oxygen saturations were monitored continuously. The                            [phone]) scope was introduced through                            the mouth, and used to inject contrast into and                            used to cannulate the bile duct. The ERCP was                            somewhat difficult due to challenging cannulation.                            The patient tolerated the procedure well. Scope In: Scope Out: Findings:      The scout film was normal. The major papilla was normal but without       appreciable bile flow pre-instrumentation. Wire kept preferentially       entering pancreatic duct; duct passage in suspected region of bile duct       recurrently unsuccessful, suggestion of impacted distal bile duct stone.       A wire was passed into the ventral pancreatic duct. One 5 Fr by 5 cm       pancreatic stent with a single external pigtail and a single internal       flap was placed into the ventral pancreatic duct. The stent was in good       position. A 3 mm biliary sphincterotomy was made with a needle knife       over a pancreatic stent using blended current. There was no       post-sphincterotomy bleeding. With time, the  bile duct was cannulated       over the pancreatic duct stent. Cholangiogram showed 10mm proximal CBD       and 4 mm mid-to-distal CBC. There was an 8-mm impacted distal CBD stone       and some stone debris. The biliary sphincterotomy was enlarged to about       8mm lengthy using blended current. There was no post-sphincterotomy       bleeding. Basket placed in bile duct, and lithotripsy with the       mechanical lithotripter was successful for stone fragmentation and       removal. Multiple sweeps with basket removed debris. The biliary tree       was swept starting at the  upper third of the main bile duct, middle       third of the main bile duct and lower third of the main duct. Sludge was       swept from the duct. A few stones were removed. Post-completion       occlusion cholangiogram showed no further bile duct stones. Drainage       from bile duct adequate post procedure. Impression:               - The major papilla appeared normal.                           - Choledocholithiasis was found; impacted bile duct                            stone.                           - One pancreatic stent was placed into the ventral                            pancreatic duct.                           - A biliary sphincterotomy was performed.                           - Lithotripsy was successful for stone                            fragmentation and removal.                           - The biliary tree was swept. No residual                            choledocholithiasis noted. Moderate Sedation:      None Recommendation:           - Avoid aspirin and nonsteroidal anti-inflammatory                            medicines for 3 days.                           - Watch for pancreatitis, bleeding, perforation,  and cholangitis.                           - Observe patient's clinical course.                           - Gi team will follow.                           - Hopeful cholecystectomy this admission. Procedure Code(s):        --- Professional ---                           [phone], Endoscopic retrograde                            cholangiopancreatography (ERCP); with placement of                            endoscopic stent into biliary or pancreatic duct,                            including pre- and post-dilation and guide wire                            passage, when performed, including sphincterotomy,                            when performed, each stent                           43265, Endoscopic retrograde                             cholangiopancreatography (ERCP); with destruction                            of calculi, any method (eg, mechanical,                            electrohydraulic, lithotripsy)                           43262, 59, Endoscopic retrograde                            cholangiopancreatography (ERCP); with                            sphincterotomy/papillotomy Diagnosis Code(s):        --- Professional ---                           K80.50, Calculus of bile duct without cholangitis                            or cholecystitis without obstruction  R74.8, Abnormal levels of other serum enzymes                           R93.5, Abnormal findings on diagnostic imaging of                            other abdominal regions, including retroperitoneum CPT copyright 2017 [employer]. All rights reserved. The codes documented in this report are preliminary and upon coder review may  be revised to meet current compliance requirements. Willis ModenaWilliam Damere Brandenburg, MD 07/11/2018 12:52:25 PM This report has been signed electronically. Number of Addenda: 0

## 2018-07-11 NOTE — Transfer of Care (Signed)
Immediate Anesthesia Transfer of Care Note  Patient: Jake Arias  Procedure(s) Performed: ENDOSCOPIC RETROGRADE CHOLANGIOPANCREATOGRAPHY (ERCP) (N/A )  Patient Location: PACU  Anesthesia Type:General  Level of Consciousness: awake, alert  and oriented  Airway & Oxygen Therapy: Patient Spontanous Breathing and Patient connected to face mask oxygen  Post-op Assessment: Report given to RN and Post -op Vital signs reviewed and stable  Post vital signs: Reviewed and stable  Last Vitals:  Vitals Value Taken Time  BP 126/77 07/11/2018 12:45 PM  Temp    Pulse 102 07/11/2018 12:47 PM  Resp 14 07/11/2018 12:47 PM  SpO2 100 % 07/11/2018 12:47 PM  Vitals shown include unvalidated device data.  Last Pain:  Vitals:   07/11/18 1000  TempSrc: Oral  PainSc: 0-No pain      Patients Stated Pain Goal: 3 (07/11/18 0909)  Complications: No apparent anesthesia complications

## 2018-07-11 NOTE — Progress Notes (Signed)
Day of Surgery   Subjective/Chief Complaint: Feels better   Objective: Vital signs in last 24 hours: Temp:  [98.1 F (36.7 C)-98.9 F (37.2 C)] 98.1 F (36.7 C) (07/21 0525) Pulse Rate:  [61-95] 63 (07/21 0525) Resp:  [13-18] 16 (07/21 0525) BP: (101-138)/(57-80) 130/76 (07/21 0525) SpO2:  [91 %-100 %] 98 % (07/21 0525) Last BM Date: 07/09/18  Intake/Output from previous day: 07/20 0701 - 07/21 0700 In: 105 [I.V.:55; IV Piggyback:50] Out: -  Intake/Output this shift: No intake/output data recorded.  General appearance: alert and cooperative Resp: clear to auscultation bilaterally Cardio: regular rate and rhythm GI: soft, mild tenderness  Lab Results:  Recent Labs    07/10/18 0033 07/11/18 0459  WBC 6.1 6.2  HGB 14.9 14.8  HCT 43.3 42.5  PLT 209 184   BMET Recent Labs    07/10/18 0033 07/11/18 0459  NA 136 135  K 3.9 4.0  CL 103 102  CO2 25 28  GLUCOSE 137* 143*  BUN 12 7  CREATININE 0.73 0.64  CALCIUM 9.4 9.0   PT/INR No results for input(s): LABPROT, INR in the last 72 hours. ABG No results for input(s): PHART, HCO3 in the last 72 hours.  Invalid input(s): PCO2, PO2  Studies/Results: US Abdomen Complete  Result Date: 07/10/2018 CLINICAL DATA:  Initial evaluation for acute epigastric abdominal pain, elevated LFTs EXAM: ABDOMEN ULTRASOUND COMPLETE COMPARISON:  None. FINDINGS: Gallbladder: Multiple shadowing stones present within the gallbladder lumen measuring up to 7 mm in size. Gallbladder wall measure within normal limits at 2.2 mm. No free pericholecystic fluid. No sonographic Murphy sign elicited on exam. Common bile duct: Diameter: 5 mm Liver: No focal lesion identified. Within normal limits in parenchymal echogenicity. Portal vein is patent on color Doppler imaging with normal direction of blood flow towards the liver. IVC: No abnormality visualized. Pancreas: Visualized portion unremarkable. Spleen: Size and appearance within normal limits. Right  Kidney: Length: 11.6 cm. Echogenicity within normal limits. No mass or hydronephrosis visualized. Left Kidney: Length: 11.2 cm. Echogenicity within normal limits. No mass or hydronephrosis visualized. Abdominal aorta: No aneurysm visualized. Other findings: None. IMPRESSION: 1. Cholelithiasis without sonographic features for acute cholecystitis. 2. No biliary dilatation. 3. Otherwise normal abdominal ultrasound. Electronically Signed   By: Rise Mu M.D.   On: 07/10/2018 06:33   Mr 3d Recon At Scanner  Result Date: 07/10/2018 CLINICAL DATA:  Epigastric abdominal pain and vomiting. Elevated liver function tests. Cholelithiasis. EXAM: MRI ABDOMEN WITHOUT AND WITH CONTRAST (INCLUDING MRCP) TECHNIQUE: Multiplanar multisequence MR imaging of the abdomen was performed both before and after the administration of intravenous contrast. Heavily T2-weighted images of the biliary and pancreatic ducts were obtained, and three-dimensional MRCP images were rendered by post processing. CONTRAST:  18mL MULTIHANCE GADOBENATE DIMEGLUMINE 529 MG/ML IV SOLN COMPARISON:  Ultrasound on 07/10/2018 FINDINGS: Lower chest: No acute findings. Hepatobiliary: No hepatic masses identified. Hepatic steatosis is demonstrated on chemical shift imaging. The gallbladder is distended and contains multiple small gallstones. No evidence of abnormal gallbladder wall thickening or pericholecystic inflammatory changes. Mild diffuse biliary ductal dilatation is seen with proximal common bile duct measuring 11 mm in diameter. A calculus is seen in the midportion of the common bile duct, with normal caliber of the distal common bile duct. Pancreas: No mass or inflammatory changes. No evidence of pancreatic ductal dilatation. Spleen:  Within normal limits in size and appearance. Adrenals/Urinary Tract: No masses identified. No evidence of hydronephrosis. Stomach/Bowel: Visualized portion unremarkable. Vascular/Lymphatic: No pathologically  enlarged lymph  nodes identified. No abdominal aortic aneurysm. Other:  None. Musculoskeletal:  No suspicious bone lesions identified. IMPRESSION: Cholelithiasis, without definite signs of cholecystitis. Diffuse biliary ductal dilatation, with prominent calculus in the mid common bile duct. Hepatic steatosis. Electronically Signed   By: Myles RosenthalJohn  Stahl M.D.   On: 07/10/2018 19:48   Mr Abdomen Mrcp Vivien RossettiW Wo Contast  Result Date: 07/10/2018 CLINICAL DATA:  Epigastric abdominal pain and vomiting. Elevated liver function tests. Cholelithiasis. EXAM: MRI ABDOMEN WITHOUT AND WITH CONTRAST (INCLUDING MRCP) TECHNIQUE: Multiplanar multisequence MR imaging of the abdomen was performed both before and after the administration of intravenous contrast. Heavily T2-weighted images of the biliary and pancreatic ducts were obtained, and three-dimensional MRCP images were rendered by post processing. CONTRAST:  18mL MULTIHANCE GADOBENATE DIMEGLUMINE 529 MG/ML IV SOLN COMPARISON:  Ultrasound on 07/10/2018 FINDINGS: Lower chest: No acute findings. Hepatobiliary: No hepatic masses identified. Hepatic steatosis is demonstrated on chemical shift imaging. The gallbladder is distended and contains multiple small gallstones. No evidence of abnormal gallbladder wall thickening or pericholecystic inflammatory changes. Mild diffuse biliary ductal dilatation is seen with proximal common bile duct measuring 11 mm in diameter. A calculus is seen in the midportion of the common bile duct, with normal caliber of the distal common bile duct. Pancreas: No mass or inflammatory changes. No evidence of pancreatic ductal dilatation. Spleen:  Within normal limits in size and appearance. Adrenals/Urinary Tract: No masses identified. No evidence of hydronephrosis. Stomach/Bowel: Visualized portion unremarkable. Vascular/Lymphatic: No pathologically enlarged lymph nodes identified. No abdominal aortic aneurysm. Other:  None. Musculoskeletal:  No suspicious bone  lesions identified. IMPRESSION: Cholelithiasis, without definite signs of cholecystitis. Diffuse biliary ductal dilatation, with prominent calculus in the mid common bile duct. Hepatic steatosis. Electronically Signed   By: Myles RosenthalJohn  Stahl M.D.   On: 07/10/2018 19:48    Anti-infectives: Anti-infectives (From admission, onward)   Start     Dose/Rate Route Frequency Ordered Stop   07/11/18 0930  Ampicillin-Sulbactam (UNASYN) 3 g in sodium chloride 0.9 % 100 mL IVPB     3 g 200 mL/hr over 30 Minutes Intravenous  Once 07/11/18 0919        Assessment/Plan: s/p Procedure(s): ENDOSCOPIC RETROGRADE CHOLANGIOPANCREATOGRAPHY (ERCP) (N/A) LFT's elevated. MRI shows cbd stone  For ERCP today Hopefully will be able to do lap chole tomorrow  LOS: 1 day    TOTH III,PAUL S 07/11/2018

## 2018-07-11 NOTE — Interval H&P Note (Signed)
History and Physical Interval Note:  07/11/2018 10:32 AM  Jake Arias  has presented today for surgery, with the diagnosis of cbd stone  The various methods of treatment have been discussed with the patient and family. After consideration of risks, benefits and other options for treatment, the patient has consented to  Procedure(s): ENDOSCOPIC RETROGRADE CHOLANGIOPANCREATOGRAPHY (ERCP) (N/A) as a surgical intervention .  The patient's history has been reviewed, patient examined, no change in status, stable for surgery.  I have reviewed the patient's chart and labs.  Questions were answered to the patient's satisfaction.     Jake Arias  Assessment:  1.  Choledocholithiasis on MRCP. 2.  Elevated LFTs.  Plan:  1.  ERCP with anticipated biliary sphincterotomy and bile duct stone extraction. 2.  Risks (up to and including bleeding, infection, perforation, pancreatitis that can be complicated by infected necrosis and death), benefits (removal of stones, alleviating blockage, decreasing risk of cholangitis or choledocholithiasis-related pancreatitis), and alternatives (watchful waiting, percutaneous transhepatic cholangiography) of ERCP were explained to patient/family in detail and patient elects to proceed.

## 2018-07-11 NOTE — Progress Notes (Signed)
Pt returned to room via wheelchair accompanied by MRI tech. Tech used Engineer, structuralspanish translator to explain to patient that his test was not completed due to him having become extremely nauseated and vomited after receiving contrast.  Pt acknowledged that he understand information given. Denies any c/o at this time and states he has not questions to ask. Family members at bedside.

## 2018-07-11 NOTE — Progress Notes (Addendum)
Daily Rounding Note  07/11/2018, 8:11 AM  LOS: 1 day   SUBJECTIVE:   Chief complaint:   Abdominal pain much improved.  No nausea.  Asking if he can brush his teeth.  NPO Spoke with pt explained CBD stone and ERCP with benefits, risks with pt and wife using video translator.    OBJECTIVE:         Vital signs in last 24 hours:    Temp:  [98.1 F (36.7 C)-98.9 F (37.2 C)] 98.1 F (36.7 C) (07/21 0525) Pulse Rate:  [61-95] 63 (07/21 0525) Resp:  [13-18] 16 (07/21 0525) BP: (101-138)/(57-80) 130/76 (07/21 0525) SpO2:  [91 %-100 %] 98 % (07/21 0525) Last BM Date: 07/09/18 There were no vitals filed for this visit. General: looks well.  Jaundice hard to appreciate with his natural coloring but + icteric.    Heart: RRR Chest: clear bil.   Abdomen: soft, NT, ND.  Active BS  Extremities: no CCE Neuro/Psych:  Pleasant, fully alert/oriented  Intake/Output from previous day: 07/20 0701 - 07/21 0700 In: 105 [I.V.:55; IV Piggyback:50] Out: -   Intake/Output this shift: No intake/output data recorded.  Lab Results: Recent Labs    07/10/18 0033 07/11/18 0459  WBC 6.1 6.2  HGB 14.9 14.8  HCT 43.3 42.5  PLT 209 184   BMET Recent Labs    07/10/18 0033 07/11/18 0459  NA 136 135  K 3.9 4.0  CL 103 102  CO2 25 28  GLUCOSE 137* 143*  BUN 12 7  CREATININE 0.73 0.64  CALCIUM 9.4 9.0   LFT Recent Labs    07/10/18 0033 07/11/18 0459  PROT 8.1 7.3  ALBUMIN 4.9 4.3  AST 586* 320*  ALT 733* 768*  ALKPHOS 114 129*  BILITOT 3.4* 6.7*   PT/INR No results for input(s): LABPROT, INR in the last 72 hours.  Studies/Results: US Abdomen Complete  Result Date: 07/10/2018 CLINICAL DATA:  Initial evaluation for acute epigastric abdominal pain, elevated LFTs EXAM: ABDOMEN ULTRASOUND COMPLETE COMPARISON:  None. FINDINGS: Gallbladder: Multiple shadowing stones present within the gallbladder lumen measuring up to 7 mm in  size. Gallbladder wall measure within normal limits at 2.2 mm. No free pericholecystic fluid. No sonographic Murphy sign elicited on exam. Common bile duct: Diameter: 5 mm Liver: No focal lesion identified. Within normal limits in parenchymal echogenicity. Portal vein is patent on color Doppler imaging with normal direction of blood flow towards the liver. IVC: No abnormality visualized. Pancreas: Visualized portion unremarkable. Spleen: Size and appearance within normal limits. Right Kidney: Length: 11.6 cm. Echogenicity within normal limits. No mass or hydronephrosis visualized. Left Kidney: Length: 11.2 cm. Echogenicity within normal limits. No mass or hydronephrosis visualized. Abdominal aorta: No aneurysm visualized. Other findings: None. IMPRESSION: 1. Cholelithiasis without sonographic features for acute cholecystitis. 2. No biliary dilatation. 3. Otherwise normal abdominal ultrasound. Electronically Signed   By: Rise Mu M.D.   On: 07/10/2018 06:33    Mr Abdomen Mrcp Vivien Rossetti Contast  Result Date: 07/10/2018 CLINICAL DATA:  Epigastric abdominal pain and vomiting. Elevated liver function tests. Cholelithiasis. EXAM: MRI ABDOMEN WITHOUT AND WITH CONTRAST (INCLUDING MRCP) TECHNIQUE: Multiplanar multisequence MR imaging of the abdomen was performed both before and after the administration of intravenous contrast. Heavily T2-weighted images of the biliary and pancreatic ducts were obtained, and three-dimensional MRCP images were rendered by post processing. CONTRAST:  18mL MULTIHANCE GADOBENATE DIMEGLUMINE 529 MG/ML IV SOLN COMPARISON:  Ultrasound on 07/10/2018 FINDINGS:  Lower chest: No acute findings. Hepatobiliary: No hepatic masses identified. Hepatic steatosis is demonstrated on chemical shift imaging. The gallbladder is distended and contains multiple small gallstones. No evidence of abnormal gallbladder wall thickening or pericholecystic inflammatory changes. Mild diffuse biliary ductal  dilatation is seen with proximal common bile duct measuring 11 mm in diameter. A calculus is seen in the midportion of the common bile duct, with normal caliber of the distal common bile duct. Pancreas: No mass or inflammatory changes. No evidence of pancreatic ductal dilatation. Spleen:  Within normal limits in size and appearance. Adrenals/Urinary Tract: No masses identified. No evidence of hydronephrosis. Stomach/Bowel: Visualized portion unremarkable. Vascular/Lymphatic: No pathologically enlarged lymph nodes identified. No abdominal aortic aneurysm. Other:  None. Musculoskeletal:  No suspicious bone lesions identified. IMPRESSION: Cholelithiasis, without definite signs of cholecystitis. Diffuse biliary ductal dilatation, with prominent calculus in the mid common bile duct. Hepatic steatosis. Electronically Signed   By: Myles RosenthalJohn  Stahl M.D.   On: 07/10/2018 19:48    ASSESMENT:   *   Cholelithiasis  *   Choledocholithiasis.  Abdominal pain improved.  LFTs rising.    *  Glucose intolerance.  Should have fup as pt has fhx and demographic risk for type 2 DM.     PLAN   *   ERCP.  Explained all to pt /wife: procedure risks benefits.  Used web-based biliary illustration to outline details.  Pt and wife understand risks, benefits and asked appropriate questions. Dr Dulce Sellarutlaw is covering for ERCP today which is set for 10 AM.   preop unasyn, post op indocin pr ordered.   Note he did get SQ Heparin with last dose @ 0522 this AM (q 8 hour dosing).   I am stopping this, PAS hose ordered.        Jennye MoccasinSarah Jeaneane Adamec  07/11/2018, 8:11 AM Phone [phone]

## 2018-07-11 NOTE — Anesthesia Procedure Notes (Signed)
Procedure Name: Intubation Date/Time: 07/11/2018 10:41 AM Performed by: Xaine Sansom D, CRNA Pre-anesthesia Checklist: Patient identified, Emergency Drugs available, Suction available and Patient being monitored Patient Re-evaluated:Patient Re-evaluated prior to induction Oxygen Delivery Method: Circle system utilized Preoxygenation: Pre-oxygenation with 100% oxygen Induction Type: IV induction Ventilation: Mask ventilation without difficulty Laryngoscope Size: Mac and 4 Tube type: Oral Tube size: 7.5 mm Number of attempts: 1 Airway Equipment and Method: Stylet Placement Confirmation: ETT inserted through vocal cords under direct vision,  positive ETCO2 and breath sounds checked- equal and bilateral Secured at: 22 cm Tube secured with: Tape Dental Injury: Teeth and Oropharynx as per pre-operative assessment

## 2018-07-11 NOTE — Anesthesia Postprocedure Evaluation (Signed)
Anesthesia Post Note  Patient: Jake Arias  Procedure(s) Performed: ENDOSCOPIC RETROGRADE CHOLANGIOPANCREATOGRAPHY (ERCP) (N/A )     Patient location during evaluation: PACU Anesthesia Type: General Level of consciousness: awake and alert Pain management: pain level controlled Vital Signs Assessment: post-procedure vital signs reviewed and stable Respiratory status: spontaneous breathing, nonlabored ventilation, respiratory function stable and patient connected to nasal cannula oxygen Cardiovascular status: blood pressure returned to baseline and stable Postop Assessment: no apparent nausea or vomiting Anesthetic complications: no    Last Vitals:  Vitals:   07/11/18 1315 07/11/18 1334  BP: 119/75 131/81  Pulse: 95 81  Resp: 14 16  Temp: 36.8 C 37.1 C  SpO2: 97% 99%    Last Pain:  Vitals:   07/11/18 1334  TempSrc: Oral  PainSc:                  Shelton SilvasKevin D Areta Terwilliger

## 2018-07-11 NOTE — Anesthesia Preprocedure Evaluation (Addendum)
Anesthesia Evaluation  Patient identified by MRN, date of birth, ID band Patient awake    Reviewed: Allergy & Precautions, NPO status , Patient's Chart, lab work & pertinent test results  Airway Mallampati: II  TM Distance: >3 FB Neck ROM: Full    Dental  (+) Teeth Intact, Dental Advisory Given   Pulmonary neg pulmonary ROS,    breath sounds clear to auscultation       Cardiovascular negative cardio ROS   Rhythm:Regular Rate:Tachycardia     Neuro/Psych negative neurological ROS  negative psych ROS   GI/Hepatic negative GI ROS, Neg liver ROS,   Endo/Other  negative endocrine ROS  Renal/GU negative Renal ROS     Musculoskeletal negative musculoskeletal ROS (+)   Abdominal (+) + obese,   Peds  Hematology negative hematology ROS (+)   Anesthesia Other Findings   Reproductive/Obstetrics                            Anesthesia Physical Anesthesia Plan  ASA: II  Anesthesia Plan: General   Post-op Pain Management:    Induction: Intravenous  PONV Risk Score and Plan: 3 and Ondansetron, Midazolam and Dexamethasone  Airway Management Planned: Oral ETT  Additional Equipment: None  Intra-op Plan:   Post-operative Plan: Extubation in OR  Informed Consent: I have reviewed the patients History and Physical, chart, labs and discussed the procedure including the risks, benefits and alternatives for the proposed anesthesia with the patient or authorized representative who has indicated his/her understanding and acceptance.   Dental advisory given  Plan Discussed with: CRNA  Anesthesia Plan Comments:        Anesthesia Quick Evaluation

## 2018-07-12 ENCOUNTER — Encounter (HOSPITAL_COMMUNITY): Payer: Self-pay | Admitting: Anesthesiology

## 2018-07-12 ENCOUNTER — Encounter (HOSPITAL_COMMUNITY): Admission: EM | Disposition: A | Discharge status: Home / Self Care | Payer: Self-pay | Source: Home / Self Care

## 2018-07-12 ENCOUNTER — Inpatient Hospital Stay (HOSPITAL_COMMUNITY): Payer: Self-pay | Admitting: Anesthesiology

## 2018-07-12 HISTORY — PX: CHOLECYSTECTOMY: SHX55

## 2018-07-12 LAB — COMPREHENSIVE METABOLIC PANEL
ALBUMIN: 4.2 g/dL (ref 3.5–5.0)
ALT: 586 U/L — ABNORMAL HIGH (ref 0–44)
ANION GAP: 6 (ref 5–15)
AST: 129 U/L — ABNORMAL HIGH (ref 15–41)
Alkaline Phosphatase: 118 U/L (ref 38–126)
BILIRUBIN TOTAL: 2.7 mg/dL — AB (ref 0.3–1.2)
BUN: 11 mg/dL (ref 6–20)
CHLORIDE: 105 mmol/L (ref 98–111)
CO2: 27 mmol/L (ref 22–32)
Calcium: 9.3 mg/dL (ref 8.9–10.3)
Creatinine, Ser: 0.79 mg/dL (ref 0.61–1.24)
GFR calc Af Amer: >60 mL/min (ref >60)
Glucose, Bld: 126 mg/dL — ABNORMAL HIGH (ref 70–99)
POTASSIUM: 4.1 mmol/L (ref 3.5–5.1)
SODIUM: 138 mmol/L (ref 135–145)
TOTAL PROTEIN: 7.2 g/dL (ref 6.5–8.1)

## 2018-07-12 LAB — CBC
HEMATOCRIT: 41.8 % (ref 39.0–52.0)
Hemoglobin: 14.3 g/dL (ref 13.0–17.0)
MCH: 31.8 pg (ref 26.0–34.0)
MCHC: 34.2 g/dL (ref 30.0–36.0)
MCV: 93.1 fL (ref 78.0–100.0)
Platelets: 200 10*3/uL (ref 150–400)
RBC: 4.49 MIL/uL (ref 4.22–5.81)
RDW: 12.8 % (ref 11.5–15.5)
WBC: 9.8 10*3/uL (ref 4.0–10.5)

## 2018-07-12 LAB — LIPASE, BLOOD: Lipase: 51 U/L (ref 11–51)

## 2018-07-12 LAB — SURGICAL PCR SCREEN
MRSA, PCR: NEGATIVE
STAPHYLOCOCCUS AUREUS: POSITIVE — AB

## 2018-07-12 SURGERY — LAPAROSCOPIC CHOLECYSTECTOMY WITH INTRAOPERATIVE CHOLANGIOGRAM
Anesthesia: General | Site: Abdomen

## 2018-07-12 MED ORDER — BUPIVACAINE-EPINEPHRINE (PF) 0.25% -1:200000 IJ SOLN
INTRAMUSCULAR | Status: AC
  Filled 2018-07-12: qty 30

## 2018-07-12 MED ORDER — MUPIROCIN 2 % EX OINT
TOPICAL_OINTMENT | Freq: Two times a day (BID) | CUTANEOUS | Status: DC
  Administered 2018-07-12: 1 via NASAL
  Administered 2018-07-13: 10:00:00 via NASAL
  Filled 2018-07-12: qty 22

## 2018-07-12 MED ORDER — HEMOSTATIC AGENTS (NO CHARGE) OPTIME
TOPICAL | Status: DC | PRN
  Administered 2018-07-12: 1 via TOPICAL

## 2018-07-12 MED ORDER — PROPOFOL 10 MG/ML IV BOLUS
INTRAVENOUS | Status: DC | PRN
  Administered 2018-07-12: 140 mg via INTRAVENOUS

## 2018-07-12 MED ORDER — FENTANYL CITRATE (PF) 100 MCG/2ML IJ SOLN
INTRAMUSCULAR | Status: AC
  Filled 2018-07-12: qty 2

## 2018-07-12 MED ORDER — IOPAMIDOL (ISOVUE-300) INJECTION 61%
INTRAVENOUS | Status: AC
  Filled 2018-07-12: qty 50

## 2018-07-12 MED ORDER — CEFAZOLIN SODIUM-DEXTROSE 2-3 GM-%(50ML) IV SOLR
INTRAVENOUS | Status: DC | PRN
  Administered 2018-07-12: 2 g via INTRAVENOUS

## 2018-07-12 MED ORDER — SUGAMMADEX SODIUM 200 MG/2ML IV SOLN
INTRAVENOUS | Status: AC
  Filled 2018-07-12: qty 2

## 2018-07-12 MED ORDER — CEFAZOLIN SODIUM-DEXTROSE 2-4 GM/100ML-% IV SOLN
INTRAVENOUS | Status: AC
  Filled 2018-07-12: qty 100

## 2018-07-12 MED ORDER — DEXAMETHASONE SODIUM PHOSPHATE 10 MG/ML IJ SOLN
INTRAMUSCULAR | Status: AC
  Filled 2018-07-12: qty 1

## 2018-07-12 MED ORDER — BUPIVACAINE HCL (PF) 0.5 % IJ SOLN
INTRAMUSCULAR | Status: AC
  Filled 2018-07-12: qty 30

## 2018-07-12 MED ORDER — KETOROLAC TROMETHAMINE 30 MG/ML IJ SOLN: 30.0000 mg | Freq: Once | INTRAMUSCULAR | Status: DC | PRN

## 2018-07-12 MED ORDER — PROPOFOL 10 MG/ML IV BOLUS
INTRAVENOUS | Status: AC
Start: 2018-07-12 — End: ?
  Filled 2018-07-12: qty 40

## 2018-07-12 MED ORDER — HYDROMORPHONE HCL 1 MG/ML IJ SOLN
INTRAMUSCULAR | Status: AC
  Filled 2018-07-12: qty 1

## 2018-07-12 MED ORDER — ONDANSETRON HCL 4 MG/2ML IJ SOLN
INTRAMUSCULAR | Status: AC
  Filled 2018-07-12: qty 2

## 2018-07-12 MED ORDER — LACTATED RINGERS IV SOLN
INTRAVENOUS | Status: DC
  Administered 2018-07-12: 12:00:00 via INTRAVENOUS

## 2018-07-12 MED ORDER — ONDANSETRON HCL 4 MG/2ML IJ SOLN
INTRAMUSCULAR | Status: DC | PRN
  Administered 2018-07-12: 4 mg via INTRAVENOUS

## 2018-07-12 MED ORDER — MIDAZOLAM HCL 2 MG/2ML IJ SOLN
INTRAMUSCULAR | Status: AC
  Filled 2018-07-12: qty 2

## 2018-07-12 MED ORDER — ROCURONIUM BROMIDE 10 MG/ML (PF) SYRINGE
PREFILLED_SYRINGE | INTRAVENOUS | Status: DC | PRN
  Administered 2018-07-12: 50 mg via INTRAVENOUS

## 2018-07-12 MED ORDER — MIDAZOLAM HCL 5 MG/5ML IJ SOLN
INTRAMUSCULAR | Status: DC | PRN
  Administered 2018-07-12: 2 mg via INTRAVENOUS

## 2018-07-12 MED ORDER — PROMETHAZINE HCL 25 MG/ML IJ SOLN: 6.2500 mg | INTRAMUSCULAR | Status: DC | PRN

## 2018-07-12 MED ORDER — LACTATED RINGERS IR SOLN
Status: DC | PRN
  Administered 2018-07-12: 1000 mL

## 2018-07-12 MED ORDER — HYDROMORPHONE HCL 1 MG/ML IJ SOLN
0.2500 mg | INTRAMUSCULAR | Status: DC | PRN
  Administered 2018-07-12: 0.5 mg via INTRAVENOUS

## 2018-07-12 MED ORDER — BUPIVACAINE HCL (PF) 0.5 % IJ SOLN
INTRAMUSCULAR | Status: DC | PRN
  Administered 2018-07-12: 20 mL

## 2018-07-12 MED ORDER — FENTANYL CITRATE (PF) 100 MCG/2ML IJ SOLN
INTRAMUSCULAR | Status: DC | PRN
  Administered 2018-07-12: 50 ug via INTRAVENOUS
  Administered 2018-07-12 (×2): 25 ug via INTRAVENOUS
  Administered 2018-07-12: 100 ug via INTRAVENOUS

## 2018-07-12 MED ORDER — SUGAMMADEX SODIUM 200 MG/2ML IV SOLN
INTRAVENOUS | Status: DC | PRN
  Administered 2018-07-12: 200 mg via INTRAVENOUS

## 2018-07-12 MED ORDER — DEXAMETHASONE SODIUM PHOSPHATE 10 MG/ML IJ SOLN
INTRAMUSCULAR | Status: DC | PRN
  Administered 2018-07-12: 10 mg via INTRAVENOUS

## 2018-07-12 MED ORDER — LIDOCAINE 2% (20 MG/ML) 5 ML SYRINGE
INTRAMUSCULAR | Status: DC | PRN
  Administered 2018-07-12: 80 mg via INTRAVENOUS

## 2018-07-12 SURGICAL SUPPLY — 28 items
APPLIER CLIP 5 13 M/L LIGAMAX5 (MISCELLANEOUS) ×3
CABLE HIGH FREQUENCY MONO STRZ (ELECTRODE) ×3 IMPLANT
CHLORAPREP W/TINT 26ML (MISCELLANEOUS) ×3 IMPLANT
CLIP APPLIE 5 13 M/L LIGAMAX5 (MISCELLANEOUS) ×1 IMPLANT
COVER MAYO STAND STRL (DRAPES) IMPLANT
DECANTER SPIKE VIAL GLASS SM (MISCELLANEOUS) IMPLANT
DERMABOND ADVANCED (GAUZE/BANDAGES/DRESSINGS) ×2
DERMABOND ADVANCED .7 DNX12 (GAUZE/BANDAGES/DRESSINGS) ×1 IMPLANT
DRAPE C-ARM 42X120 X-RAY (DRAPES) IMPLANT
ELECT REM PT RETURN 15FT ADLT (MISCELLANEOUS) ×3 IMPLANT
ENDOLOOP SUT PDS II  0 18 (SUTURE) ×2
ENDOLOOP SUT PDS II 0 18 (SUTURE) ×1 IMPLANT
GLOVE SURG SIGNA 7.5 PF LTX (GLOVE) ×3 IMPLANT
GOWN STRL REUS W/TWL XL LVL3 (GOWN DISPOSABLE) ×3 IMPLANT
HEMOSTAT SURGICEL 4X8 (HEMOSTASIS) ×3 IMPLANT
KIT BASIN OR (CUSTOM PROCEDURE TRAY) ×3 IMPLANT
POUCH SPECIMEN RETRIEVAL 10MM (ENDOMECHANICALS) ×3 IMPLANT
SCISSORS LAP 5X35 DISP (ENDOMECHANICALS) ×3 IMPLANT
SET CHOLANGIOGRAPH MIX (MISCELLANEOUS) IMPLANT
SET IRRIG TUBING LAPAROSCOPIC (IRRIGATION / IRRIGATOR) ×3 IMPLANT
SLEEVE XCEL OPT CAN 5 100 (ENDOMECHANICALS) ×6 IMPLANT
SUT MNCRL AB 4-0 PS2 18 (SUTURE) ×3 IMPLANT
TOWEL OR 17X26 10 PK STRL BLUE (TOWEL DISPOSABLE) ×3 IMPLANT
TOWEL OR NON WOVEN STRL DISP B (DISPOSABLE) ×3 IMPLANT
TRAY LAPAROSCOPIC (CUSTOM PROCEDURE TRAY) ×3 IMPLANT
TROCAR BLADELESS OPT 5 100 (ENDOMECHANICALS) ×3 IMPLANT
TROCAR XCEL BLUNT TIP 100MML (ENDOMECHANICALS) ×3 IMPLANT
TUBING INSUF HEATED (TUBING) ×3 IMPLANT

## 2018-07-12 NOTE — Anesthesia Preprocedure Evaluation (Signed)
Anesthesia Evaluation  Patient identified by MRN, date of birth, ID band Patient awake    Reviewed: Allergy & Precautions, NPO status , Patient's Chart, lab work & pertinent test results  Airway Mallampati: II  TM Distance: >3 FB Neck ROM: Full    Dental no notable dental hx.    Pulmonary neg pulmonary ROS,    Pulmonary exam normal breath sounds clear to auscultation       Cardiovascular negative cardio ROS Normal cardiovascular exam Rhythm:Regular Rate:Normal     Neuro/Psych negative neurological ROS  negative psych ROS   GI/Hepatic negative GI ROS, Neg liver ROS,   Endo/Other  negative endocrine ROS  Renal/GU negative Renal ROS  negative genitourinary   Musculoskeletal negative musculoskeletal ROS (+)   Abdominal   Peds negative pediatric ROS (+)  Hematology negative hematology ROS (+)   Anesthesia Other Findings   Reproductive/Obstetrics negative OB ROS                             Anesthesia Physical Anesthesia Plan  ASA: II  Anesthesia Plan: General   Post-op Pain Management:    Induction: Intravenous  PONV Risk Score and Plan: 2 and Ondansetron, Dexamethasone and Treatment may vary due to age or medical condition  Airway Management Planned: Oral ETT  Additional Equipment:   Intra-op Plan:   Post-operative Plan: Extubation in OR  Informed Consent: I have reviewed the patients History and Physical, chart, labs and discussed the procedure including the risks, benefits and alternatives for the proposed anesthesia with the patient or authorized representative who has indicated his/her understanding and acceptance.   Dental advisory given  Plan Discussed with: CRNA and Surgeon  Anesthesia Plan Comments:         Anesthesia Quick Evaluation  

## 2018-07-12 NOTE — Progress Notes (Signed)
Subjective: Spanish-speaking CMA on [facility] floor used for Spanish interpretation:  RUQ pain improving.  Objective: Vital signs in last 24 hours: Temp:  [97.9 F (36.6 C)-98.7 F (37.1 C)] 98.2 F (36.8 C) (07/22 0538) Pulse Rate:  [63-107] 65 (07/22 0538) Resp:  [12-19] 13 (07/22 0538) BP: (118-131)/(70-90) 118/80 (07/22 0538) SpO2:  [97 %-100 %] 99 % (07/22 0538) Weight change:  Last BM Date: 07/11/18  PE: GEN:   NAD ABD:  Soft, mild epigastric/RUQ tenderness without peritonitis  Lab Results: CBC    Component Value Date/Time   WBC 9.8 07/12/2018 0418   RBC 4.49 07/12/2018 0418   HGB 14.3 07/12/2018 0418   HCT 41.8 07/12/2018 0418   PLT 200 07/12/2018 0418   MCV 93.1 07/12/2018 0418   MCH 31.8 07/12/2018 0418   MCHC 34.2 07/12/2018 0418   RDW 12.8 07/12/2018 0418   LYMPHSABS 2.2 05/20/2016 0440   MONOABS 1.0 05/20/2016 0440   EOSABS 0.2 05/20/2016 0440   BASOSABS 0.0 05/20/2016 0440   CMP     Component Value Date/Time   NA 138 07/12/2018 0418   K 4.1 07/12/2018 0418   CL 105 07/12/2018 0418   CO2 27 07/12/2018 0418   GLUCOSE 126 (H) 07/12/2018 0418   BUN 11 07/12/2018 0418   CREATININE 0.79 07/12/2018 0418   CALCIUM 9.3 07/12/2018 0418   PROT 7.2 07/12/2018 0418   ALBUMIN 4.2 07/12/2018 0418   AST 129 (H) 07/12/2018 0418   ALT 586 (H) 07/12/2018 0418   ALKPHOS 118 07/12/2018 0418   BILITOT 2.7 (H) 07/12/2018 0418   GFRNONAA >60 07/12/2018 0418   GFRAA >60 07/12/2018 0418   Assessment:  1.  Choledocholithiasis.  ERCP with PD stent, biliary sphincterotomy, stone extraction. 2.  Elevated LFTs, downtrending after ERCP. 3.  Cholelithiasis. 4.  RUQ pain, improving.  Plan:  1.  Cholecystectomy this admission. 2.  Will need abdominal xray in 2-3 weeks to ensure PD stent passage; if not, will need elective EGD for stent removal.  [facility] has patient's phone number and we will coordinate this. 3.  [facility] will sign-off; please call with questions; thank you  for the consultation.   Freddy JakschOUTLAW,Garhett Bernhard M 07/12/2018, 10:27 AM   Cell [phone] If no answer or after 5 PM call [phone]

## 2018-07-12 NOTE — Transfer of Care (Signed)
Immediate Anesthesia Transfer of Care Note  Patient: Jake Arias  Procedure(s) Performed: Procedure(s): LAPAROSCOPIC CHOLECYSTECTOMY (N/A)  Patient Location: PACU  Anesthesia Type:General  Level of Consciousness:  sedated, patient cooperative and responds to stimulation  Airway & Oxygen Therapy:Patient Spontanous Breathing and Patient connected to face mask oxgen  Post-op Assessment:  Report given to PACU RN and Post -op Vital signs reviewed and stable  Post vital signs:  Reviewed and stable  Last Vitals:  Vitals:   07/11/18 2142 07/12/18 0538  BP: 123/81 118/80  Pulse: 63 65  Resp: 15 13  Temp: 36.9 C 36.8 C  SpO2: 100% 99%    Complications: No apparent anesthesia complications

## 2018-07-12 NOTE — Anesthesia Postprocedure Evaluation (Signed)
Anesthesia Post Note  Patient: Jake Arias  Procedure(s) Performed: LAPAROSCOPIC CHOLECYSTECTOMY (N/A Abdomen)     Patient location during evaluation: PACU Anesthesia Type: General Level of consciousness: awake and alert Pain management: pain level controlled Vital Signs Assessment: post-procedure vital signs reviewed and stable Respiratory status: spontaneous breathing, nonlabored ventilation, respiratory function stable and patient connected to nasal cannula oxygen Cardiovascular status: blood pressure returned to baseline and stable Postop Assessment: no apparent nausea or vomiting Anesthetic complications: no    Last Vitals:  Vitals:   07/12/18 1315 07/12/18 1330  BP: 136/78 133/86  Pulse: 72 72  Resp: 15 10  Temp:    SpO2: 98% 99%    Last Pain:  Vitals:   07/12/18 1320  TempSrc:   PainSc: 5                  Maryse Brierley S

## 2018-07-12 NOTE — Anesthesia Procedure Notes (Signed)
Procedure Name: Intubation Date/Time: 07/12/2018 12:12 PM Performed by: Lavina Hamman, CRNA Pre-anesthesia Checklist: Patient identified, Emergency Drugs available, Suction available, Patient being monitored and Timeout performed Patient Re-evaluated:Patient Re-evaluated prior to induction Oxygen Delivery Method: Circle system utilized Preoxygenation: Pre-oxygenation with 100% oxygen Induction Type: IV induction Ventilation: Mask ventilation without difficulty Laryngoscope Size: Mac and 4 Grade View: Grade I Tube type: Oral Tube size: 7.5 mm Number of attempts: 1 Airway Equipment and Method: Stylet Placement Confirmation: ETT inserted through vocal cords under direct vision,  positive ETCO2,  CO2 detector and breath sounds checked- equal and bilateral Secured at: 22 cm Tube secured with: Tape Dental Injury: Teeth and Oropharynx as per pre-operative assessment

## 2018-07-12 NOTE — Op Note (Signed)
Laparoscopic Cholecystectomy Procedure Note  Indications: This patient presents with symptomatic gallbladder disease and will undergo laparoscopic cholecystectomy.  He underwent and ERCP yesterday with  CBD stone removal  Pre-operative Diagnosis: cholelithiasis with cholecystitis  Post-operative Diagnosis: Same  Surgeon: Abigail MiyamotoBLACKMAN,Damany Eastman A   Assistants: 0  Anesthesia: General endotracheal anesthesia  ASA Class: 1  Procedure Details  The patient was seen again in the Holding Room. The risks, benefits, complications, treatment options, and expected outcomes were discussed with the patient. The possibilities of reaction to medication, pulmonary aspiration, perforation of viscus, bleeding, recurrent infection, finding a normal gallbladder, the need for additional procedures, failure to diagnose a condition, the possible need to convert to an open procedure, and creating a complication requiring transfusion or operation were discussed with the patient. The likelihood of improving the patient's symptoms with return to their baseline status is good.  The patient and/or family concurred with the proposed plan, giving informed consent. The site of surgery properly noted. The patient was taken to Operating Room, identified as Jestin Karel JarvisLeon Ortiz and the procedure verified as Laparoscopic Cholecystectomy with Intraoperative Cholangiogram. A Time Out was held and the above information confirmed.  Prior to the induction of general anesthesia, antibiotic prophylaxis was administered. General endotracheal anesthesia was then administered and tolerated well. After the induction, the abdomen was prepped with Chloraprep and draped in sterile fashion. The patient was positioned in the supine position.  Local anesthetic agent was injected into the skin near the umbilicus and an incision made. We dissected down to the abdominal fascia with blunt dissection.  The fascia was incised vertically and we entered the  peritoneal cavity bluntly.  A pursestring suture of 0-Vicryl was placed around the fascial opening.  The Hasson cannula was inserted and secured with the stay suture.  Pneumoperitoneum was then created with CO2 and tolerated well without any adverse changes in the patient's vital signs. An 11-mm port was placed in the subxiphoid position.  Two 5-mm ports were placed in the right upper quadrant. All skin incisions were infiltrated with a local anesthetic agent before making the incision and placing the trocars.   We positioned the patient in reverse Trendelenburg, tilted slightly to the patient's left.  The gallbladder was identified, the fundus grasped and retracted cephalad. Adhesions were lysed bluntly and with the electrocautery where indicated, taking care not to injure any adjacent organs or viscus. The infundibulum was grasped and retracted laterally, exposing the peritoneum overlying the triangle of Calot. This was then divided and exposed in a blunt fashion. The cystic duct was clearly identified and bluntly dissected circumferentially. A critical view of the cystic duct and cystic artery was obtained.  The cystic duct was then ligated with clips and divided. The cystic artery was, dissected free, ligated with clips and divided as well. I also placed an endoloop on the cystic stump as it was thickened and enlarged.  The gallbladder was dissected from the liver bed in retrograde fashion with the electrocautery. The gallbladder was removed and placed in an Endocatch sac. The liver bed was irrigated and inspected. Hemostasis was achieved with the electrocautery and a piece of Surgicel. Copious irrigation was utilized and was repeatedly aspirated until clear.  The gallbladder and Endocatch sac were then removed through the umbilical port site.  The pursestring suture was used to close the umbilical fascia.    We again inspected the right upper quadrant for hemostasis.  Pneumoperitoneum was released as we  removed the trocars.  4-0 Monocryl was used  to close the skin.   Skin glue was then applied. The patient was then extubated and brought to the recovery room in stable condition. Instrument, sponge, and needle counts were correct at closure and at the conclusion of the case.   Findings: Cholecystitis with Cholelithiasis  Estimated Blood Loss: Minimal         Drains: 0         Specimens: Gallbladder           Complications: None; patient tolerated the procedure well.         Disposition: PACU - hemodynamically stable.         Condition: stable

## 2018-07-12 NOTE — Progress Notes (Signed)
[facility]/Trauma Progress Note  1 Day Post-Op   Assessment/Plan Cholelithiasis with Choledocholithiasis - S/P ERCP 07/21 with success removal of CBD stone and pancreatic stent placement - LFT's and tbili are trending down - OR today for lap chole  FEN: NPO VTE: SCD's, lovenox to start tomorrow POD1 ID: [email or web address] 7/21>> Foley:  none Follow up: TBD  DISPO: OR today for lap chole, am labs tomorrow to trend LFT's    LOS: 2 days    Subjective: CC; mild abdominal pain  No nausea, vomiting, fever or chills overnight. No new issues. Wife at bedside.   Objective: Vital signs in last 24 hours: Temp:  [97.9 F (36.6 C)-99.1 F (37.3 C)] 98.2 F (36.8 C) (07/22 0538) Pulse Rate:  [63-107] 65 (07/22 0538) Resp:  [12-19] 13 (07/22 0538) BP: (118-139)/(70-90) 118/80 (07/22 0538) SpO2:  [97 %-100 %] 99 % (07/22 0538) Weight:  [85.3 kg (188 lb)] 85.3 kg (188 lb) (07/21 1000) Last BM Date: 07/11/18  Intake/Output from previous day: 07/21 0701 - 07/22 0700 In: 3011.2 [P.O.:320; I.V.:2494.5; IV Piggyback:196.7] Out: -  Intake/Output this shift: No intake/output data recorded.  PE: Gen:  Alert, NAD, pleasant, cooperative Card:  RRR, no M/G/R heard Pulm:  CTA, no W/R/R, effort normal Abd: Soft, ND, +BS, no HSM, TTP RUQ, no guarding Skin: no rashes noted, warm and dry   Anti-infectives: Anti-infectives (From admission, onward)   Start     Dose/Rate Route Frequency Ordered Stop   07/11/18 0930  Ampicillin-Sulbactam (UNASYN) 3 g in sodium chloride 0.9 % 100 mL IVPB     3 g 200 mL/hr over 30 Minutes Intravenous  Once 07/11/18 0919 07/11/18 1143      Lab Results:  Recent Labs    07/11/18 0459 07/12/18 0418  WBC 6.2 9.8  HGB 14.8 14.3  HCT 42.5 41.8  PLT 184 200   BMET Recent Labs    07/11/18 0459 07/12/18 0418  NA 135 138  K 4.0 4.1  CL 102 105  CO2 28 27  GLUCOSE 143* 126*  BUN 7 11  CREATININE 0.64 0.79  CALCIUM 9.0 9.3   PT/INR No results  for input(s): LABPROT, INR in the last 72 hours. CMP     Component Value Date/Time   NA 138 07/12/2018 0418   K 4.1 07/12/2018 0418   CL 105 07/12/2018 0418   CO2 27 07/12/2018 0418   GLUCOSE 126 (H) 07/12/2018 0418   BUN 11 07/12/2018 0418   CREATININE 0.79 07/12/2018 0418   CALCIUM 9.3 07/12/2018 0418   PROT 7.2 07/12/2018 0418   ALBUMIN 4.2 07/12/2018 0418   AST 129 (H) 07/12/2018 0418   ALT 586 (H) 07/12/2018 0418   ALKPHOS 118 07/12/2018 0418   BILITOT 2.7 (H) 07/12/2018 0418   GFRNONAA >60 07/12/2018 0418   GFRAA >60 07/12/2018 0418   Lipase     Component Value Date/Time   LIPASE 51 07/12/2018 0418    Studies/Results: Mr 3d Recon At Scanner  Result Date: 07/10/2018 CLINICAL DATA:  Epigastric abdominal pain and vomiting. Elevated liver function tests. Cholelithiasis. EXAM: MRI ABDOMEN WITHOUT AND WITH CONTRAST (INCLUDING MRCP) TECHNIQUE: Multiplanar multisequence MR imaging of the abdomen was performed both before and after the administration of intravenous contrast. Heavily T2-weighted images of the biliary and pancreatic ducts were obtained, and three-dimensional MRCP images were rendered by post processing. CONTRAST:  18mL MULTIHANCE GADOBENATE DIMEGLUMINE 529 MG/ML IV SOLN COMPARISON:  Ultrasound on 07/10/2018 FINDINGS: Lower chest: No acute findings. Hepatobiliary: No  hepatic masses identified. Hepatic steatosis is demonstrated on chemical shift imaging. The gallbladder is distended and contains multiple small gallstones. No evidence of abnormal gallbladder wall thickening or pericholecystic inflammatory changes. Mild diffuse biliary ductal dilatation is seen with proximal common bile duct measuring 11 mm in diameter. A calculus is seen in the midportion of the common bile duct, with normal caliber of the distal common bile duct. Pancreas: No mass or inflammatory changes. No evidence of pancreatic ductal dilatation. Spleen:  Within normal limits in size and appearance.  Adrenals/Urinary Tract: No masses identified. No evidence of hydronephrosis. Stomach/Bowel: Visualized portion unremarkable. Vascular/Lymphatic: No pathologically enlarged lymph nodes identified. No abdominal aortic aneurysm. Other:  None. Musculoskeletal:  No suspicious bone lesions identified. IMPRESSION: Cholelithiasis, without definite signs of cholecystitis. Diffuse biliary ductal dilatation, with prominent calculus in the mid common bile duct. Hepatic steatosis. Electronically Signed   By: Myles RosenthalJohn  Stahl M.D.   On: 07/10/2018 19:48   Dg Ercp Biliary & Pancreatic Ducts  Result Date: 07/11/2018 CLINICAL DATA:  Choledocholithiasis and coli lithiasis by MRCP EXAM: ERCP with balloon sweep TECHNIQUE: Multiple spot images obtained with the fluoroscopic device and submitted for interpretation post-procedure. FLUOROSCOPY TIME:  Fluoroscopy Time:  11 minutes 32 seconds Radiation Exposure Index (if provided by the fluoroscopic device): 80.9 mGy Number of Acquired Spot Images: 7 COMPARISON:  07/10/2018 FINDINGS: Spot fluoroscopic views during the ERCP demonstrate retrograde access of the biliary tree with contrast injection. Mild biliary dilatation. Filling defects in the common bile duct. Balloon sweep performed for stone removal. IMPRESSION: Choledocholithiasis.  Balloon sweep for stone removal. These images were submitted for radiologic interpretation only. Please see the procedural report for the amount of contrast and the fluoroscopy time utilized. Electronically Signed   By: Judie PetitM.  Shick M.D.   On: 07/11/2018 14:41   Mr Abdomen Mrcp Vivien RossettiW Wo Contast  Result Date: 07/10/2018 CLINICAL DATA:  Epigastric abdominal pain and vomiting. Elevated liver function tests. Cholelithiasis. EXAM: MRI ABDOMEN WITHOUT AND WITH CONTRAST (INCLUDING MRCP) TECHNIQUE: Multiplanar multisequence MR imaging of the abdomen was performed both before and after the administration of intravenous contrast. Heavily T2-weighted images of the biliary  and pancreatic ducts were obtained, and three-dimensional MRCP images were rendered by post processing. CONTRAST:  18mL MULTIHANCE GADOBENATE DIMEGLUMINE 529 MG/ML IV SOLN COMPARISON:  Ultrasound on 07/10/2018 FINDINGS: Lower chest: No acute findings. Hepatobiliary: No hepatic masses identified. Hepatic steatosis is demonstrated on chemical shift imaging. The gallbladder is distended and contains multiple small gallstones. No evidence of abnormal gallbladder wall thickening or pericholecystic inflammatory changes. Mild diffuse biliary ductal dilatation is seen with proximal common bile duct measuring 11 mm in diameter. A calculus is seen in the midportion of the common bile duct, with normal caliber of the distal common bile duct. Pancreas: No mass or inflammatory changes. No evidence of pancreatic ductal dilatation. Spleen:  Within normal limits in size and appearance. Adrenals/Urinary Tract: No masses identified. No evidence of hydronephrosis. Stomach/Bowel: Visualized portion unremarkable. Vascular/Lymphatic: No pathologically enlarged lymph nodes identified. No abdominal aortic aneurysm. Other:  None. Musculoskeletal:  No suspicious bone lesions identified. IMPRESSION: Cholelithiasis, without definite signs of cholecystitis. Diffuse biliary ductal dilatation, with prominent calculus in the mid common bile duct. Hepatic steatosis. Electronically Signed   By: Myles RosenthalJohn  Stahl M.D.   On: 07/10/2018 19:48      Jerre SimonJessica L Atara Paterson , [facility] 07/12/2018, 9:44 AM  Pager: [phone] Mon-Wed, Friday 7:00am-4:30pm Thurs 7am-11:30am  Consults: [phone]

## 2018-07-13 ENCOUNTER — Encounter (HOSPITAL_COMMUNITY): Payer: Self-pay | Admitting: Surgery

## 2018-07-13 LAB — COMPREHENSIVE METABOLIC PANEL
ALBUMIN: 4.2 g/dL (ref 3.5–5.0)
ALT: 483 U/L — AB (ref 0–44)
AST: 146 U/L — AB (ref 15–41)
Alkaline Phosphatase: 113 U/L (ref 38–126)
Anion gap: 10 (ref 5–15)
BUN: 9 mg/dL (ref 6–20)
CHLORIDE: 99 mmol/L (ref 98–111)
CO2: 29 mmol/L (ref 22–32)
CREATININE: 0.81 mg/dL (ref 0.61–1.24)
Calcium: 9.2 mg/dL (ref 8.9–10.3)
GFR calc Af Amer: >60 mL/min (ref >60)
GFR calc non Af Amer: >60 mL/min (ref >60)
GLUCOSE: 112 mg/dL — AB (ref 70–99)
Potassium: 3.7 mmol/L (ref 3.5–5.1)
SODIUM: 138 mmol/L (ref 135–145)
Total Bilirubin: 1.7 mg/dL — ABNORMAL HIGH (ref 0.3–1.2)
Total Protein: 7.2 g/dL (ref 6.5–8.1)

## 2018-07-13 MED ORDER — HYDROCODONE-ACETAMINOPHEN 5-325 MG PO TABS
1.0000 | ORAL_TABLET | Freq: Four times a day (QID) | ORAL | Status: DC | PRN
  Administered 2018-07-13: 1 via ORAL
  Filled 2018-07-13: qty 1

## 2018-07-13 MED ORDER — HYDROCODONE-ACETAMINOPHEN 5-325 MG PO TABS: 1.0000 | ORAL_TABLET | Freq: Four times a day (QID) | ORAL | 0 refills | Status: DC | PRN

## 2018-07-13 NOTE — Discharge Instructions (Signed)
CIRUGIA LAPAROSCOPICA: INSTRUCCIONES DE POST OPERATORIO. ° °Revise siempre los documentos que le entreguen en el lugar donde se ha hecho la cirugia. ° °SI USTED NECESITA DOCUMENTOS DE INCAPACIDAD (DISABLE) O DE PERMISO FAMILAR (FAMILY LEAVE) NECESITA TRAERLOS A LA OFICINA PARA QUE SEAN PROCESADOS. °NO  SE LOS DE A SU DOCTOR. °1. A su alta del hospital se le dara una receta para controlar el dolor. Tomela como ha sido recetada, si la necesita. Si no la necesita puede tomar, Acetaminofen (Tylenol) o Ibuprofen (Advil) para aliviar dolor moderado. °2. Continue tomando el resto de sus medicinas. °3. Si necesita rellenar la receta, llame a la farmacia. ellos contactan a nuestra oficina pidiendo autorizacion. Este tipo de receta no pueden ser rellenadas despues de las  5pm o durante los fines de semana. °4. Con relacion a la dieta: debe ser ligera los primeros dias despues que llege a la casa. Ejemplo: sopas y galleticas. Tome bastante liquido esos dias. °5. La mayoria de los pacientes padecen de inflamacion y cambio de coloracion de la piel alrededor de las incisiones. esto toma dias en resolver.  pnerse una bolsa de hielo en el area affectada ayuda..  °6. Es comun tambien tener un poco de estrenimiento si esta tomado medicinas para el dolor. incremente la cantidad de liquidos a tomar y puede tomar (Colace) esto previene el problema. Si ya tiene estrenimiento, es decir no ha defecado en 48 horas, puede tomar un laxativo (Milk of Magnesia or Miralax) uselo como el paquete le explica. °7.  A menos que se le diga algo diferente. Remueva el bendaje a las 24-48 horas despues dela cirugia. y puede banarse en la ducha sin ningun problema. usted puede tener steri-strips (pequenas curitas transparentes en la piel puesta encima de la incision)  Estas banditas strips should be left on the skin for 7-10 days.   Si su cirujano puso pegamento encima de la incision usted puede banarse bajo la ducha en 24 horas. Este pegamento empezara a  caerse en las proximas 2-3 semanas. Si le pusieron suturas o presillas (grapos) estos seran quitados en su proxima cita en la oficina. . °a. ACTIVIDADES:  Puede hacer actividad ligera.  Como caminar , subir escaleras y poco a poco irlas incrementando tanto como las tolere. Puede tener relaciones sexuales cuando sea comfortable. No carge objetos pesados o haga esfuerzos que no sean aprovados por su doctor. °b. Puede manejar en cuanto no esta tomando medicamentos fuertes (narcoticos) para el dolor, pueda abrochar confortablemente el cinturon de seguridad, y pueda maniobrar y usar los pedales de su vehiculo con seguridad. °c. PUEDE REGRESAR A TRABAJAR  °8. Debe ver a su doctor para una cita de seguimiento en 2-3 semanas despues de la cirugia.  °9. OTRAS ISNSTRUCCIONES:___________________________________________________________________________________ °CUANDO LLAMAR A SU MEDICO: °1. FIEBRE mayor de  101.0 °2. No produccion de orina. °3. Sangramiento continue de la herida °4. Incremento de dolor, enrojecimientio o drenaje de la herida (incision) °5. Incremento de dolor abdominal. ° °The clinic staff is available to answer your questions during regular business hours.  Please don’t hesitate to call and ask to speak to one of the nurses for clinical concerns.  If you have a medical emergency, go to the nearest emergency room or call 911.  A surgeon from [facility] is always on call at the hospital. °[address], [address], [place], [place]  [address] ? [address] [address], [place], [place]   [address] °[phone] ? [phone] ? FAX [phone] °Web site: [email or web address] ° ° °

## 2018-07-13 NOTE — Progress Notes (Signed)
Discharge instructions reviewed with patient using iPad translator. All questions answered. Patient wheeled to vehicle with belongings by nurse tech

## 2018-07-13 NOTE — Discharge Summary (Signed)
[facility] Discharge Summary   Patient ID: Jake Arias MRN: [id number] DOB/AGE: 12-30-1990 27 y.o.  Admit date: 07/10/2018 Discharge date: 07/13/2018  Admitting Diagnosis: Cholelithiasis with Choledocholithiasis  Discharge Diagnosis Patient Active Problem List   Diagnosis Date Noted  . Cholelithiasis 07/10/2018    Consultants Gastroenterology, Dr. Dulce Sellarutlaw  Imaging: Dg Ercp Biliary & Pancreatic Ducts  Result Date: 07/11/2018 CLINICAL DATA:  Choledocholithiasis and coli lithiasis by MRCP EXAM: ERCP with balloon sweep TECHNIQUE: Multiple spot images obtained with the fluoroscopic device and submitted for interpretation post-procedure. FLUOROSCOPY TIME:  Fluoroscopy Time:  11 minutes 32 seconds Radiation Exposure Index (if provided by the fluoroscopic device): 80.9 mGy Number of Acquired Spot Images: 7 COMPARISON:  07/10/2018 FINDINGS: Spot fluoroscopic views during the ERCP demonstrate retrograde access of the biliary tree with contrast injection. Mild biliary dilatation. Filling defects in the common bile duct. Balloon sweep performed for stone removal. IMPRESSION: Choledocholithiasis.  Balloon sweep for stone removal. These images were submitted for radiologic interpretation only. Please see the procedural report for the amount of contrast and the fluoroscopy time utilized. Electronically Signed   By: Judie PetitM.  Shick M.D.   On: 07/11/2018 14:41    Procedures Dr. Dulce Sellarutlaw (07/11/18) - ERCP with pancreatic duct placement Dr. Magnus IvanBlackman (07/12/18) - Laparoscopic Cholecystectomy   Hospital Course:  Pt is a 27 yo Hispanic male who presented to ED with abdominal pain.  Workup showed Cholelithiasis with Choledocholithiasis. Patient was admitted and underwent procedures listed above. Tolerated procedures well.  Diet was advanced as tolerated. LFT's and tbili trended down. On 07/23, the patient was voiding well, tolerating diet, ambulating well, pain well controlled, vital signs  stable, incisions c/d/i and felt stable for discharge home.  Patient will follow up as outlined below and knows to call with questions or concerns.  He will call to confirm appointment date/time.    Patient was discharged in good condition.  [employer] interpreter was used when communicating with this patient during his hospital stay.  The [place] Substance controlled database was reviewed prior to prescribing narcotic pain medication to this patient.  Physical Exam: General:  Alert, NAD, pleasant, cooperative Cardio: RRR, S1 & S2 normal, no murmur, rubs, gallops Resp: Effort normal, lungs CTA bilaterally, no wheezes, rales, rhonchi Abd:  Soft, ND, normal bowel sounds, incisions with glue intact are without drainage or surrounding erythema, mild TTP in RUQ, no guarding, no signs of peritonitis.   Extremities: no TTP, swelling or redness of calves b/l Skin: warm and dry  Allergies as of 07/13/2018      Reactions   Contrast Media [iodinated Diagnostic Agents] Nausea And Vomiting      Medication List    TAKE these medications   HYDROcodone-acetaminophen 5-325 MG tablet Commonly known as:  NORCO/VICODIN Take 1 tablet by mouth every 6 (six) hours as needed for moderate pain.   pantoprazole 20 MG tablet Commonly known as:  PROTONIX Take 20 mg by mouth daily.        Follow-up Information    [facility] [place] Call.   Specialty:  General Surgery Why:  we are working on a follow up appointment for you. Call our office to see when your appointment is.  Contact information: [address] [place] [id number] [phone]       Willis Modenautlaw, William, MD. Call.   Specialty:  Gastroenterology Why:  to set up an appointment for 3 weeks from date of discharge. You need to follow up to get  an xray to see if the pancreatic stent has passed.  Contact information: [address] [place] [address] [phone]            Signed: Joyce Copa [facility] 07/13/2018, 8:44 AM Pager: [phone] Consults: [phone] Mon-Fri 7:00 am-4:30 pm Sat-Sun 7:00 am-11:30 am

## 2018-09-24 ENCOUNTER — Encounter: Payer: Self-pay | Admitting: Family Medicine

## 2018-09-24 ENCOUNTER — Ambulatory Visit: Payer: Self-pay | Attending: Family Medicine | Admitting: Family Medicine

## 2018-09-24 VITALS — BP 128/77 | HR 90 | Temp 98.2°F | Resp 18 | Ht 68.0 in | Wt 192.0 lb

## 2018-09-24 DIAGNOSIS — K8020 Calculus of gallbladder without cholecystitis without obstruction: Secondary | ICD-10-CM | POA: Insufficient documentation

## 2018-09-24 DIAGNOSIS — Z23 Encounter for immunization: Secondary | ICD-10-CM

## 2018-09-24 DIAGNOSIS — R1013 Epigastric pain: Secondary | ICD-10-CM

## 2018-09-24 DIAGNOSIS — K760 Fatty (change of) liver, not elsewhere classified: Secondary | ICD-10-CM | POA: Insufficient documentation

## 2018-09-24 DIAGNOSIS — Z9049 Acquired absence of other specified parts of digestive tract: Secondary | ICD-10-CM | POA: Insufficient documentation

## 2018-09-24 DIAGNOSIS — Z9889 Other specified postprocedural states: Secondary | ICD-10-CM | POA: Insufficient documentation

## 2018-09-24 DIAGNOSIS — R748 Abnormal levels of other serum enzymes: Secondary | ICD-10-CM

## 2018-09-24 MED ORDER — OMEPRAZOLE 20 MG PO CPDR: 20.0000 mg | DELAYED_RELEASE_CAPSULE | Freq: Every day | ORAL | 6 refills | Status: AC

## 2018-09-24 NOTE — Patient Instructions (Signed)
Opciones de alimentos para pacientes con reflujo gastroesofgico - Adultos (Food Choices for Gastroesophageal Reflux Disease, Adult) Cuando se tiene reflujo gastroesofgico (ERGE), los alimentos que se ingieren y los hbitos de alimentacin son muy importantes. Elegir los alimentos adecuados puede ayudar a aliviar las molestias. QU PAUTAS DEBO SEGUIR?  Elija las frutas, los vegetales, los cereales integrales y los productos lcteos con bajo contenido de grasa.  Elija las carnes de vaca, de pescado y de ave con bajo contenido de grasas.  Limite las grasas, como los aceites, los aderezos para ensalada, la manteca, los frutos secos y el aguacate.  Lleve un registro de alimentos. Esto ayuda a identificar los alimentos que ocasionan sntomas.  Evite los alimentos que le ocasionen sntomas. Pueden ser distintos para cada persona.  Haga comidas pequeas durante el da en lugar de 3 comidas abundantes.  Coma lentamente, en un lugar donde est distendido.  Limite el consumo de alimentos fritos.  Cocine los alimentos utilizando mtodos que no sean la fritura.  Evite el consumo alcohol.  Evite beber grandes cantidades de lquidos con las comidas.  Evite agacharse o recostarse hasta despus de 2 o 3horas de haber comido.  QU ALIMENTOS NO SE RECOMIENDAN? Estos son algunos alimentos y bebidas que pueden empeorar los sntomas: Vegetales Tomates. Jugo de tomate. Salsa de tomate y espagueti. Ajes. Cebolla y ajo. Rbano picante. Frutas Naranjas, pomelos y limn (fruta y jugo). Carnes Carnes de vaca, de pescado y de ave con gran contenido de grasas. Esto incluye los perros calientes, las costillas, el jamn, la salchicha, el salame y el tocino. Lcteos Leche entera y leche chocolatada. Crema cida. Crema. Mantequilla. Helados. Queso crema. Bebidas T o caf. Bebidas gaseosas o bebidas energizantes. Condimentos Salsa picante. Salsa barbacoa. Dulces/postres Chocolate y cacao. Rosquillas.  Menta y mentol. Grasas y aceites Alimentos muy grasos. Esto incluye las papas fritas. Otros Vinagre. Especias picantes. Esto incluye la pimienta negra, la pimienta blanca, la pimienta roja, la pimienta de cayena, el curry en polvo, los clavos de olor, el jengibre y el chile en polvo. Esta no es una lista completa de los alimentos y las bebidas que se deben evitar. Comunquese con el nutricionista para recibir ms informacin. Esta informacin no tiene como fin reemplazar el consejo del mdico. Asegrese de hacerle al mdico cualquier pregunta que tenga. Document Released: 06/08/2012 Document Revised: 12/29/2014 Document Reviewed: 10/12/2013 Elsevier Interactive Patient Education  2017 Elsevier Inc. Enfermedad por reflujo gastroesofgico en los adultos (Gastroesophageal Reflux Disease, Adult) Normalmente, los alimentos descienden por el esfago y se depositan en el estmago para su digestin. Si una persona tiene enfermedad por reflujo gastroesofgico (ERGE), los alimentos y el cido estomacal regresan al esfago. Cuando esto ocurre, el esfago se irrita y se hincha (inflama). Con el tiempo, la ERGE puede provocar la formacin de pequeas perforaciones (lceras) en la mucosa del esfago. CUIDADOS EN EL HOGAR Dieta  Siga la dieta como se lo haya indicado el mdico. Tal vez deba evitar los siguientes alimentos y bebidas: ? Caf y t (con o sin cafena). ? Bebidas que contengan alcohol. ? Bebidas energizantes y deportivas. ? Gaseosas o refrescos. ? Chocolate y cacao. ? Menta y esencias de menta. ? Ajo y cebollas. ? Rbano picante. ? Alimentos muy condimentados y cidos, como pimientos, chile en polvo, curry en polvo, vinagre, salsas picantes y salsa barbacoa. ? Frutas ctricas y sus jugos, como naranjas, limones y limas. ? Alimentos a base de tomates, como salsa roja, chile, salsa y pizza con salsa roja. ?   Alimentos fritos y grasos, como rosquillas, papas fritas y aderezos con alto contenido  de grasa. ? Carnes con alto contenido de grasa, como hot dogs, filetes de entrecot, salchicha, jamn y tocino. ? Productos lcteos con alto contenido de grasa, como leche entera, mantequilla y queso crema.  Consuma pequeas porciones de comida con ms frecuencia. Evite consumir porciones abundantes.  Evite beber mucho lquido con las comidas.  No coma durante las 2 o 3horas previas a la hora de acostarse.  No se acueste inmediatamente despus de comer.  No haga actividad fsica enseguida despus de comer. Instrucciones generales  Est atento a cualquier cambio en los sntomas.  Tome los medicamentos de venta libre y los recetados solamente como se lo haya indicado el mdico. No tome aspirina, ibuprofeno ni otros antiinflamatorios no esteroides (AINE), a menos que el mdico lo autorice.  No consuma ningn producto que contenga tabaco, lo que incluye cigarrillos, tabaco de mascar y cigarrillos electrnicos. Si necesita ayuda para dejar de fumar, consulte al mdico.  Use ropa suelta. No use nada ajustado alrededor de la cintura.  Levante (eleve) unas 6pulgadas (15centmetros) la cabecera de la cama.  Intente bajar el nivel de estrs. Si necesita ayuda para hacerlo, consulte al mdico.  Si tiene sobrepeso, adelgace hasta alcanzar un peso saludable. Pregntele a su mdico cmo puede perder peso de manera segura.  Concurra a todas las visitas de control como se lo haya indicado el mdico. Esto es importante. SOLICITE AYUDA SI:  Aparecen nuevos sntomas.  Baja de peso y no sabe por qu.  Tiene dificultad para tragar o siente dolor al hacerlo.  Tiene sibilancias o tos que no desaparece.  Los sntomas no mejoran con el tratamiento.  Tiene la voz ronca. SOLICITE AYUDA DE INMEDIATO SI:  Tiene dolor en los brazos, el cuello, los maxilares, la dentadura o la espalda.  Transpira, se marea o tiene sensacin de desvanecimiento.  Siente falta de aire o dolor en el pecho.  Vomita  y el vmito es parecido a la sangre o a los granos de caf.  Pierde el conocimiento (se desmaya).  Las heces son sanguinolentas o de color negro.  No puede tragar, beber o comer. Esta informacin no tiene como fin reemplazar el consejo del mdico. Asegrese de hacerle al mdico cualquier pregunta que tenga. Document Released: 01/10/2011 Document Revised: 08/29/2015 Document Reviewed: 04/04/2015 Elsevier Interactive Patient Education  2018 Elsevier Inc.  

## 2018-09-24 NOTE — Progress Notes (Signed)
Subjective:    Patient ID: Jake Arias, male    DOB: May 17, 1991, 27 y.o.   MRN: [id number]   Due to a language barrier, a video translation service was used at today's visit  HPI 27 year old male who is new to the practice.  Patient is status post emergency department visit on 07/10/2018 secondary to severe abdominal pain along with nausea and vomiting and patient was found to have gallstones, a stone within the bile ducts and elevated liver enzymes and patient was admitted.  Patient was found to have cholelithiasis with choledocholithiasis (on MRI, patient with mild diffuse biliary duct dilatation with proximal common bile duct measuring 11 mm in the presence of the stone in the midportion of the common bile duct) and underwent laparoscopic cholecystectomy on 07/12/2018 after having ERCP with placement of a pancreatic stent done on 07/11/2018.      Patient reports through translator that he has had no further abdominal pain and no nausea or vomiting.  Patient states that he now feels well.  Patient states that he continues to drink a few beers a couple of times per week and on the weekends.  Patient states he has decreased beer consumption status post hospitalization.  Patient does not smoke.  Patient has some occasional heartburn/reflux symptoms such as burping/belching or backwash of bad tasting liquid into his throat and mouth if he eats just before going to bed.  Patient occasionally has taken over-the-counter reflux medicine in the past as needed.  Patient reports no other significant past medical history and patient denies any other surgeries other than the removal of the gallbladder.  Patient reports negative family history for hypertension, diabetes, heart disease, stroke and cancer.  Patient is married and is accompanied at today's visit by his wife and 2 young children-a son and a daughter. Past Surgical History:  Procedure Laterality Date  . CHOLECYSTECTOMY N/A 07/12/2018   Procedure:  LAPAROSCOPIC CHOLECYSTECTOMY;  Surgeon: Abigail Miyamoto, MD;  Location: [facility];  Service: General;  Laterality: N/A;  . ERCP N/A 07/11/2018   Procedure: ENDOSCOPIC RETROGRADE CHOLANGIOPANCREATOGRAPHY (ERCP);  Surgeon: Willis Modena, MD;  Location: [facility];  Service: Endoscopy;  Laterality: N/A;  . LITHOTRIPSY  07/11/2018   Procedure: LITHOTRIPSY;  Surgeon: Willis Modena, MD;  Location: [facility];  Service: Endoscopy;;  . PANCREATIC STENT PLACEMENT  07/11/2018   Procedure: PANCREATIC STENT PLACEMENT;  Surgeon: Willis Modena, MD;  Location: [facility];  Service: Endoscopy;;  . REMOVAL OF STONES  07/11/2018   Procedure: REMOVAL OF STONES;  Surgeon: Willis Modena, MD;  Location: [facility];  Service: Endoscopy;;  Balloon Sweep  . SPHINCTEROTOMY  07/11/2018   Procedure: SPHINCTEROTOMY;  Surgeon: Willis Modena, MD;  Location: [facility];  Service: Endoscopy;;   Social History   Tobacco Use  . Smoking status: Never Smoker  . Smokeless tobacco: Never Used  Substance Use Topics  . Alcohol use: Yes    Comment: socially  . Drug use: No   Allergies  Allergen Reactions  . Contrast Media [Iodinated Diagnostic Agents] Nausea And Vomiting     Review of Systems  Constitutional: Negative for activity change, appetite change, chills, diaphoresis, fatigue, fever and unexpected weight change.  HENT: Negative for congestion, dental problem, postnasal drip, sore throat and trouble swallowing.   Respiratory: Negative for cough and shortness of breath.   Cardiovascular: Negative for chest pain, palpitations and leg swelling.  Gastrointestinal: Negative for abdominal pain, blood in stool, constipation, diarrhea, nausea and vomiting.  Occasional heartburn symptoms with late night eating  Endocrine: Negative for polydipsia, polyphagia and polyuria.  Genitourinary: Negative for dysuria, flank pain, frequency and hematuria.  Musculoskeletal: Negative for arthralgias, back pain, gait  problem, joint swelling and myalgias.  Neurological: Negative for dizziness, light-headedness, numbness and headaches.       Objective:   Physical Exam BP 128/77 (BP Location: Left Arm, Patient Position: Sitting, Cuff Size: Normal)   Pulse 90   Temp 98.2 F (36.8 C) (Oral)   Resp 18   Ht 5\' 8"  (1.727 m)   Wt 192 lb (87.1 kg)   SpO2 96%   BMI 29.19 kg/m  Vital signs and nurse's notes reviewed General-well-nourished, well-developed young adult male in no acute distress.  Patient is accompanied by his wife and children at today's visit Mouth- normal oral mucosa, mild posterior pharynx erythema Neck-supple, no lymphadenopathy no thyromegaly Lungs-clear to auscultation bilaterally Cardiovascular-regular rate and rhythm Abdomen- abdomen is nondistended, normal bowel sounds, no reproducible abdominal tenderness Back-no CVA tenderness Extremities-no edema      Assessment & Plan:  1. Elevated liver enzymes Patient with continued elevated liver enzymes at the time of hospital discharge which were most likely related to patient's gallstones along with presence of a stone within the common bile duct.  Patient also with presence of fatty liver.  Discussed with patient that he should work on decreasing his beer consumption.  Patient will have repeat CMP as he also had some mild increase in his glucose during his hospitalization. - Comprehensive metabolic panel  2. Dyspepsia Patient with complaint of some dyspepsia/reflux symptoms.  Discussed avoidance of late night eating and avoidance of known trigger foods.  Prescription given for Prilosec to take as needed and daily for about 2 weeks after any severe episode of reflux.  Call or return if symptoms do not improve or if symptoms worsen. - Comprehensive metabolic panel - omeprazole (PRILOSEC) 20 MG capsule; Take 1 capsule (20 mg total) by mouth daily. When needed to reduce stomach acid  Dispense: 30 capsule; Refill: 6  3. Need for immunization  against influenza Patient was offered and agreed to receive influenza immunization at today's visit.  Patient also received a handout regarding influenza immunization  An After Visit Summary was printed and given to the patient.  Return in about 4 months (around 01/25/2019) for labs if abnormal today.

## 2018-09-25 LAB — COMPREHENSIVE METABOLIC PANEL WITH GFR
ALT: 64 IU/L — ABNORMAL HIGH (ref 0–44)
AST: 36 IU/L (ref 0–40)
Albumin/Globulin Ratio: 2 (ref 1.2–2.2)
Albumin: 5.3 g/dL (ref 3.5–5.5)
Alkaline Phosphatase: 83 IU/L (ref 39–117)
BUN/Creatinine Ratio: 18 (ref 9–20)
BUN: 16 mg/dL (ref 6–20)
Bilirubin Total: 0.6 mg/dL (ref 0.0–1.2)
CO2: 21 mmol/L (ref 20–29)
Calcium: 9.9 mg/dL (ref 8.7–10.2)
Chloride: 100 mmol/L (ref 96–106)
Creatinine, Ser: 0.9 mg/dL (ref 0.76–1.27)
GFR calc Af Amer: 136 mL/min/1.73
GFR calc non Af Amer: 117 mL/min/1.73
Globulin, Total: 2.6 g/dL (ref 1.5–4.5)
Glucose: 90 mg/dL (ref 65–99)
Potassium: 4.1 mmol/L (ref 3.5–5.2)
Sodium: 139 mmol/L (ref 134–144)
Total Protein: 7.9 g/dL (ref 6.0–8.5)

## 2018-09-27 ENCOUNTER — Telehealth: Payer: Self-pay

## 2018-09-27 NOTE — Telephone Encounter (Signed)
Pt VM left by [facility] [phone] instructing pt to call back to triage nurse Toniann Fail per doctor request.

## 2018-09-29 NOTE — Telephone Encounter (Signed)
Medical Assistant used [employer] to contact patient.  Interpreter Name: Standley Brooking #: [id number] Patient was not available, [employer] left patient a voicemail. Voicemail states to give a call back to [place] with [facility] at [phone]. Please inform patient of CMP being normal and liver enzymes showing a big improvement. Please continue to limit alcohol and beer intake as well as tylenol which can effect the liver the same.

## 2018-09-29 NOTE — Telephone Encounter (Signed)
-----   Message from Cain Saupe, MD sent at 09/26/2018  7:17 PM EDT ----- Notify patient with use of a translator that his CMP showed normal electrolytes and that his liver enzymes have greatly improved from the elevated levels in July. AST is now normal and ALT has decreased from 768 on 07/11/18 and now at 64 (normal is 0-44). He should continue to limit his intake of alcohol/beer and tylenol which can affect the liver

## 2019-01-25 ENCOUNTER — Ambulatory Visit: Payer: Self-pay | Admitting: Family Medicine

## 2019-10-08 IMAGING — US US ABDOMEN COMPLETE
1 series · 14 of 25 positions shown · non-contrast
Comparison: None.

CLINICAL DATA: Initial evaluation for acute epigastric abdominal
pain, elevated LFTs

EXAM:
ABDOMEN ULTRASOUND COMPLETE

[Series 1: us abdomen complete · 14 of 89 slices shown]
[im 1/89]
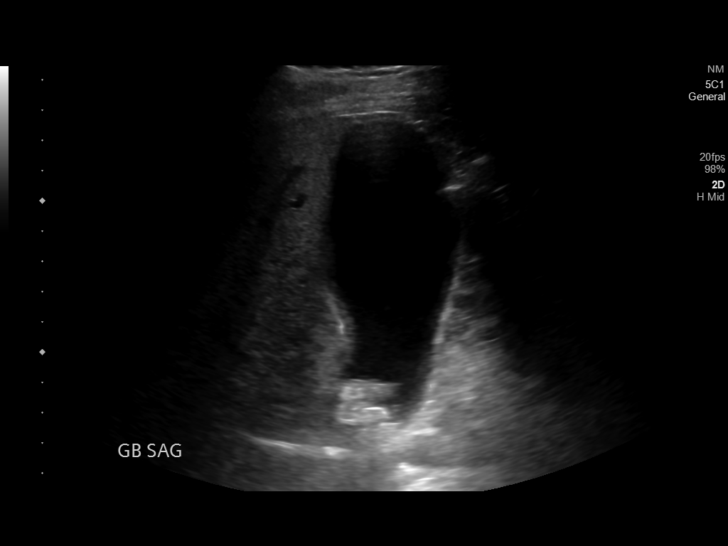
[im 8/89]
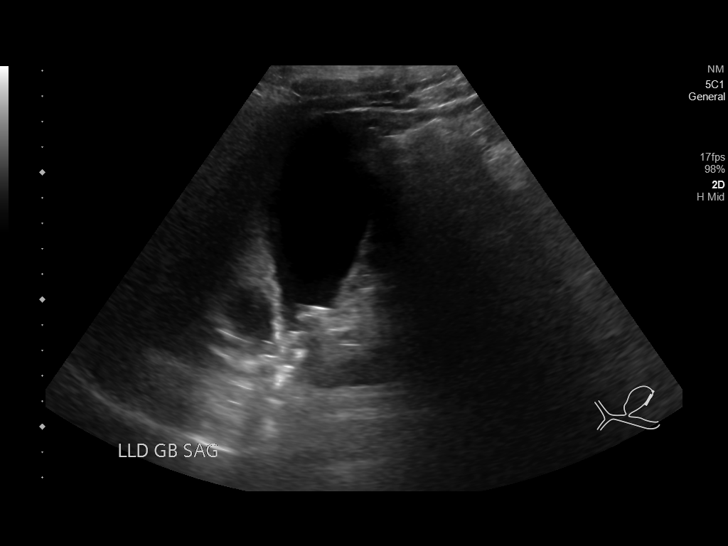
[im 15/89]
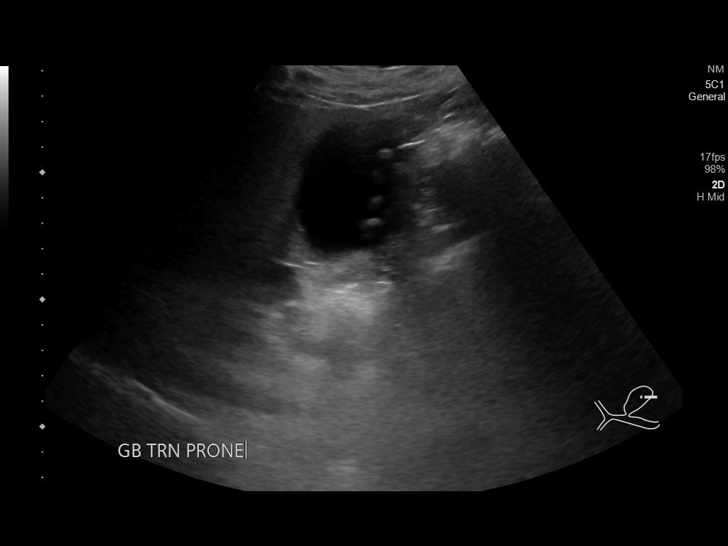
[im 23/89]
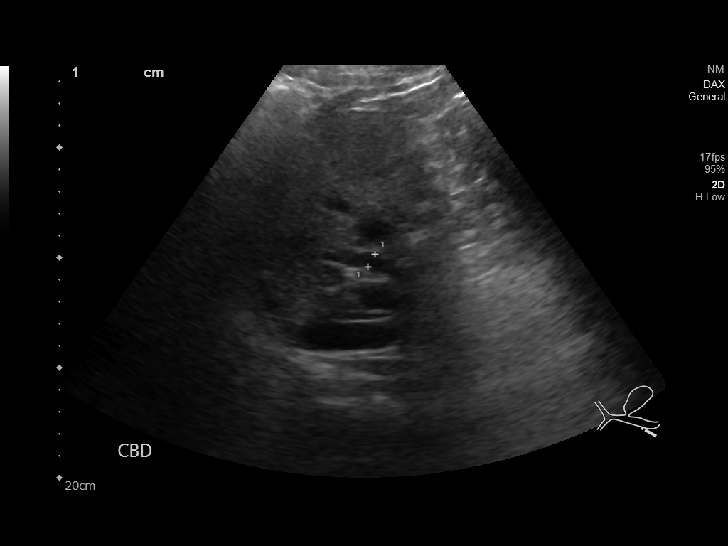
[im 30/89]
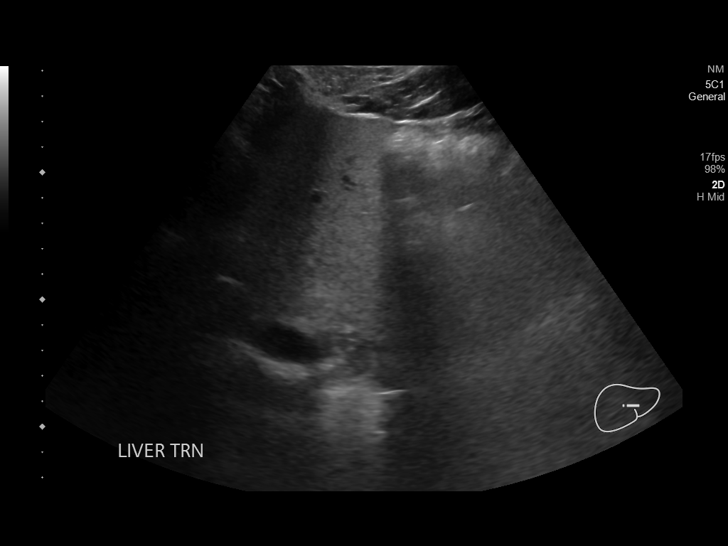
[im 34/89]
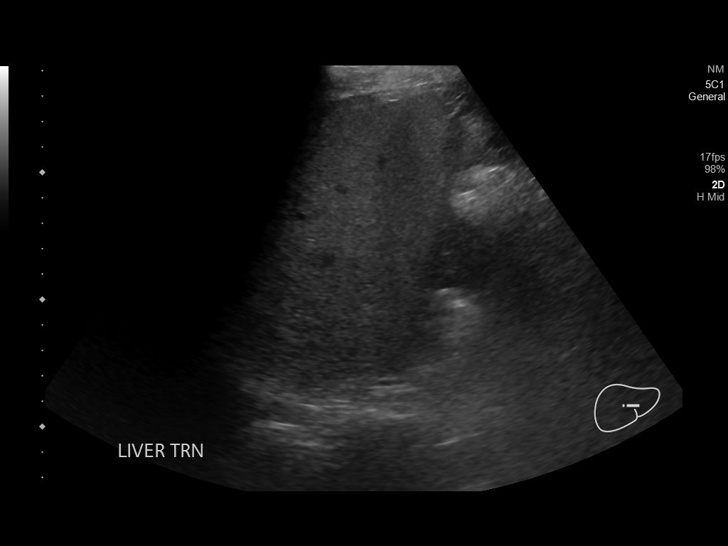
[im 41/89]
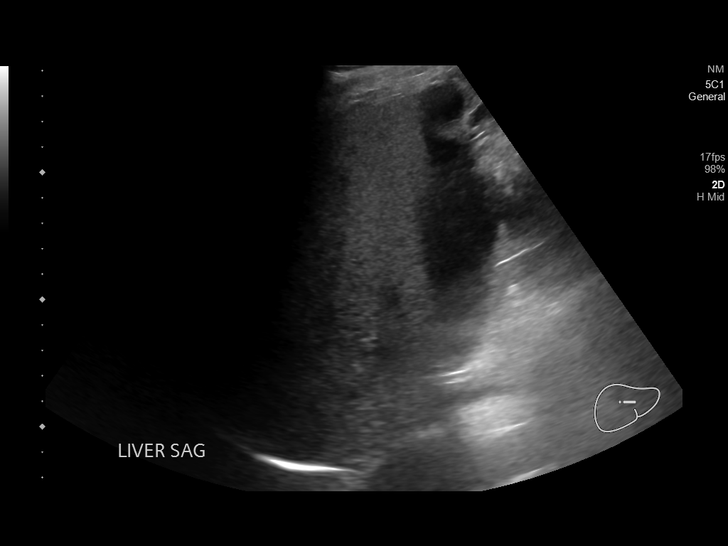
[im 48/89]
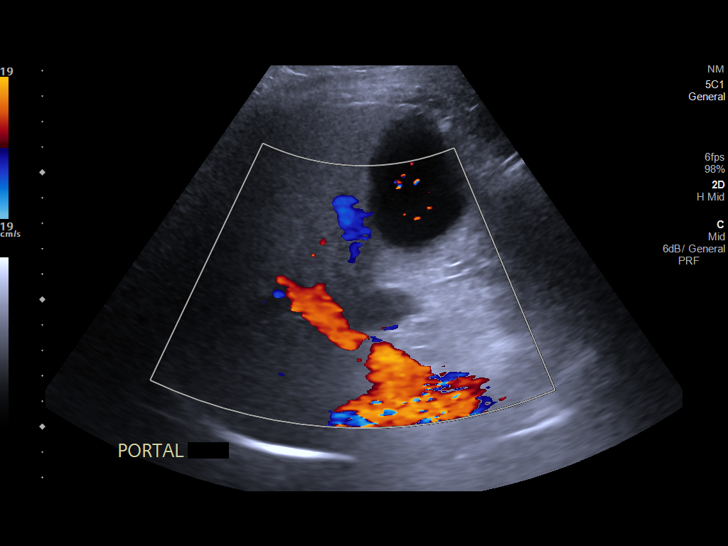
[im 56/89]
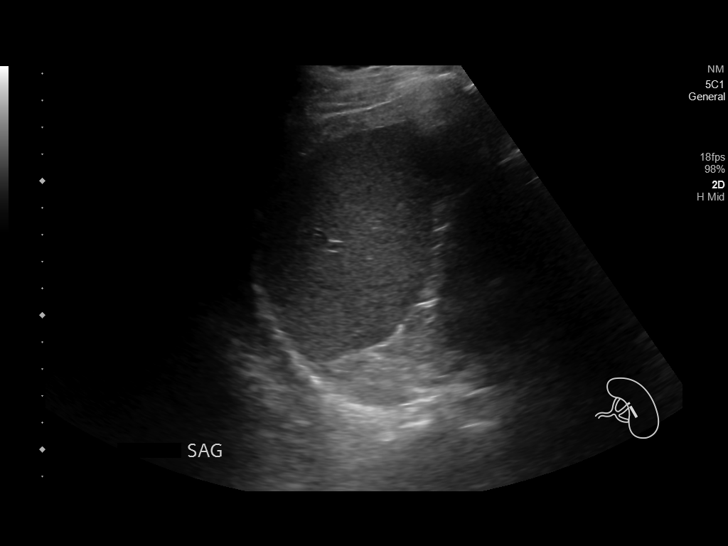
[im 59/89]
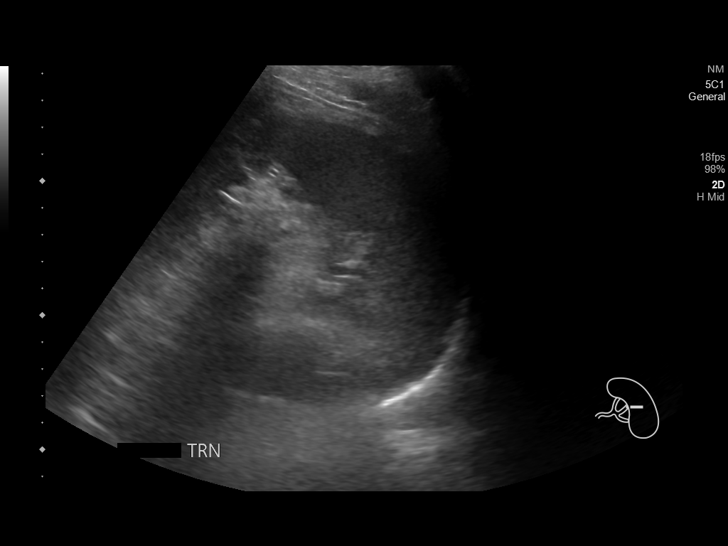
[im 67/89]
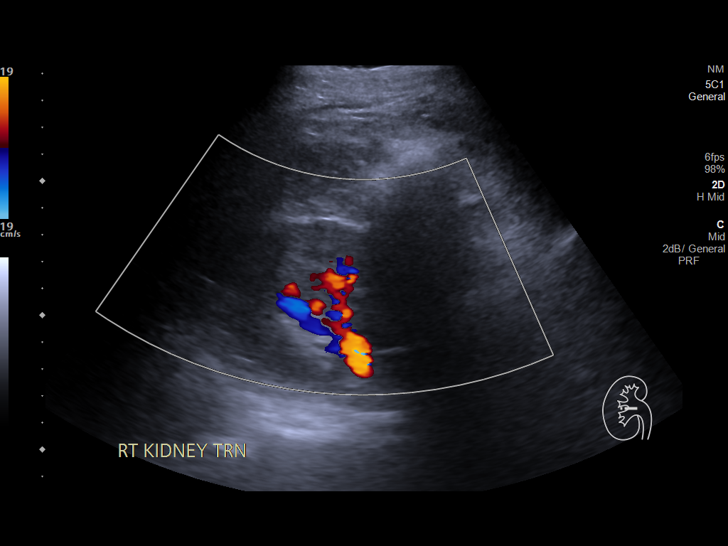
[im 74/89]
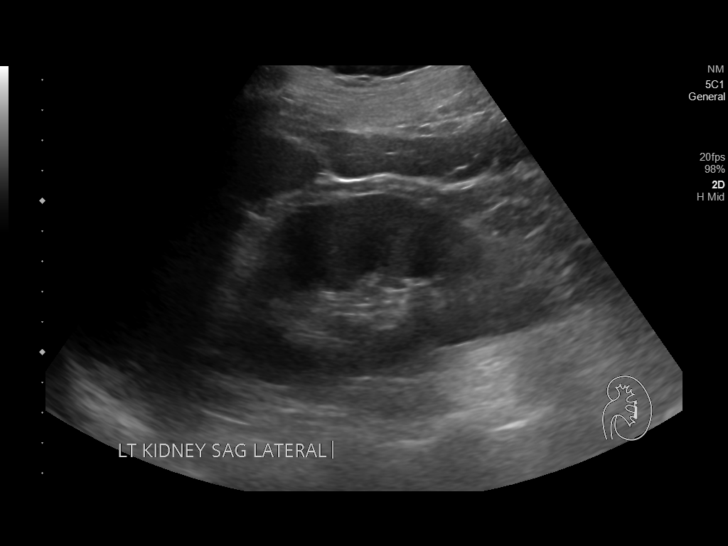
[im 81/89]
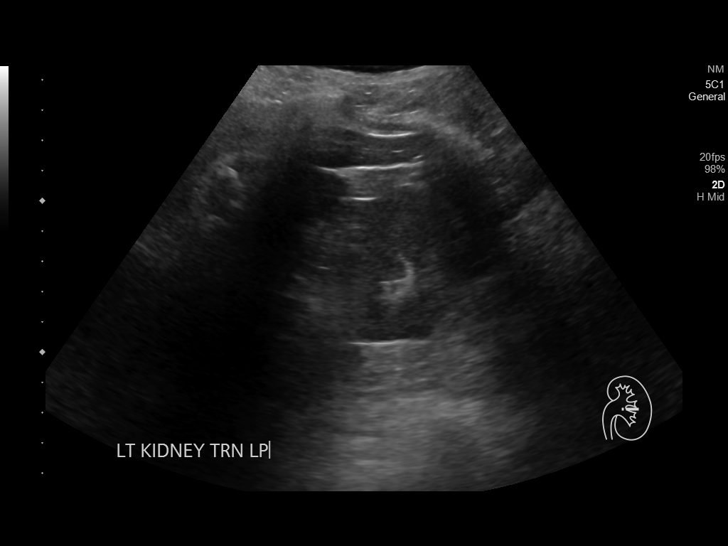
[im 89/89]
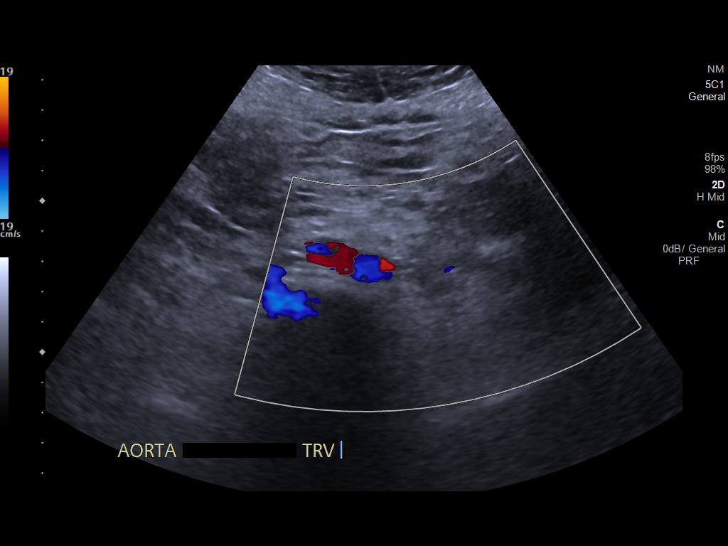

[14 of 25 positions shown; findings below may reference images not displayed]

FINDINGS: Gallbladder: Multiple shadowing stones present within the
gallbladder lumen measuring up to 7 mm in size. Gallbladder wall
measure within normal limits at 2.2 mm. No free pericholecystic
fluid. No sonographic Murphy sign elicited on exam.

Common bile duct: Diameter: 5 mm

Liver: No focal lesion identified. Within normal limits in
parenchymal echogenicity. Portal vein is patent on color Doppler
imaging with normal direction of blood flow towards the liver.

IVC: No abnormality visualized.

Pancreas: Visualized portion unremarkable.

Spleen: Size and appearance within normal limits.

Right Kidney: Length: 11.6 cm. Echogenicity within normal limits. No
mass or hydronephrosis visualized.

Left Kidney: Length: 11.2 cm. Echogenicity within normal limits. No
mass or hydronephrosis visualized.

Abdominal aorta: No aneurysm visualized.

Other findings: None.
IMPRESSION: 1. Cholelithiasis without sonographic features for acute
cholecystitis.
2. No biliary dilatation.
3. Otherwise normal abdominal ultrasound.

## 2019-10-09 IMAGING — RF DG ERCP WO/W SPHINCTEROTOMY
1 series · 1 of 1 positions shown · non-contrast
Comparison: 07/10/2018

CLINICAL DATA: Choledocholithiasis and coli lithiasis by MRCP

EXAM:
ERCP with balloon sweep
TECHNIQUE: Multiple spot images obtained with the fluoroscopic device and
submitted for interpretation post-procedure.
FLUOROSCOPY TIME:  Fluoroscopy Time:  11 minutes 32 seconds
Radiation Exposure Index (if provided by the fluoroscopic device):
80.9 mGy
Number of Acquired Spot Images: 7

[Series 1: run · 1 of 1 slices shown]
[im 1/1]
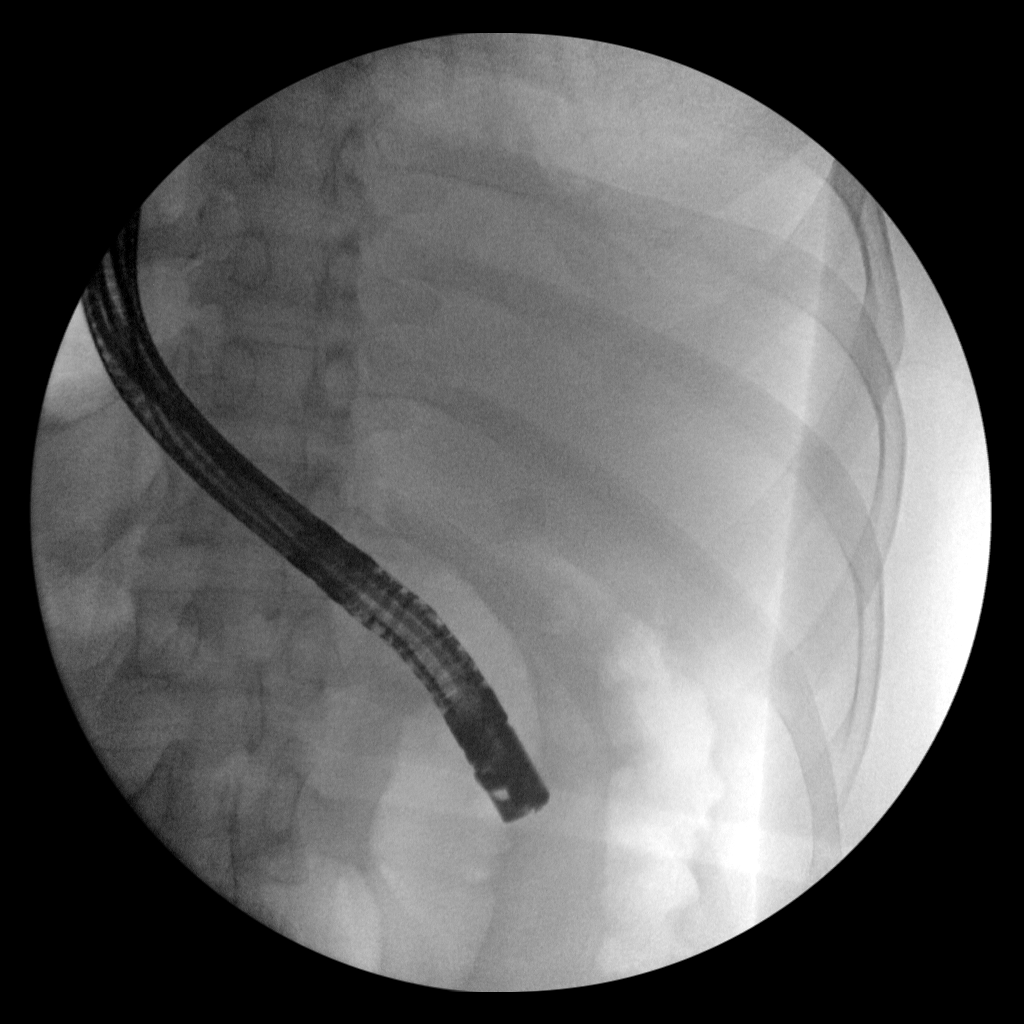

[1 of 1 positions shown; findings below may reference images not displayed]

FINDINGS: Spot fluoroscopic views during the ERCP demonstrate retrograde
access of the biliary tree with contrast injection. Mild biliary
dilatation. Filling defects in the common bile duct. Balloon sweep
performed for stone removal.
IMPRESSION: Choledocholithiasis.  Balloon sweep for stone removal.

These images were submitted for radiologic interpretation only.
Please see the procedural report for the amount of contrast and the
fluoroscopy time utilized.
